# Patient Record
Sex: Male | Born: 1965 | Race: White | Hispanic: No | Marital: Married | Smoking: Never smoker
Health system: Southern US, Community
[De-identification: ages and names within clinical notes are randomized; demographics above are authoritative.]

## PROBLEM LIST (undated history)

## (undated) DIAGNOSIS — K259 Gastric ulcer, unspecified as acute or chronic, without hemorrhage or perforation: Secondary | ICD-10-CM

## (undated) DIAGNOSIS — E785 Hyperlipidemia, unspecified: Secondary | ICD-10-CM

## (undated) DIAGNOSIS — M1712 Unilateral primary osteoarthritis, left knee: Secondary | ICD-10-CM

## (undated) DIAGNOSIS — F32A Depression, unspecified: Secondary | ICD-10-CM

## (undated) DIAGNOSIS — M109 Gout, unspecified: Secondary | ICD-10-CM

## (undated) DIAGNOSIS — R7303 Prediabetes: Secondary | ICD-10-CM

## (undated) HISTORY — PX: ROTATOR CUFF REPAIR: SHX139

## (undated) HISTORY — PX: CYST EXCISION: SHX5701

## (undated) HISTORY — PX: LASIK: SHX215

## (undated) HISTORY — PX: KNEE ARTHROSCOPY: SUR90

---

## 2010-02-12 ENCOUNTER — Ambulatory Visit: Payer: Self-pay | Admitting: Internal Medicine

## 2011-09-16 ENCOUNTER — Emergency Department: Payer: Self-pay | Admitting: *Deleted

## 2012-01-01 ENCOUNTER — Ambulatory Visit: Payer: Self-pay | Admitting: Otolaryngology

## 2012-02-17 ENCOUNTER — Ambulatory Visit: Payer: Self-pay | Admitting: Otolaryngology

## 2012-02-19 LAB — PATHOLOGY REPORT

## 2012-04-01 ENCOUNTER — Ambulatory Visit: Payer: Self-pay | Admitting: Family Medicine

## 2012-04-01 ENCOUNTER — Emergency Department: Payer: Self-pay | Admitting: Unknown Physician Specialty

## 2012-04-01 LAB — CBC WITH DIFFERENTIAL/PLATELET
Basophil #: 0 10*3/uL (ref 0.0–0.1)
Basophil %: 0.4 %
Eosinophil #: 0.2 10*3/uL (ref 0.0–0.7)
Eosinophil %: 1.6 %
HGB: 14.8 g/dL (ref 13.0–18.0)
Lymphocyte #: 1.5 10*3/uL (ref 1.0–3.6)
MCH: 30.8 pg (ref 26.0–34.0)
MCHC: 33.9 g/dL (ref 32.0–36.0)
MCV: 91 fL (ref 80–100)
Monocyte #: 0.9 x10 3/mm (ref 0.2–1.0)
Neutrophil %: 72.7 %

## 2013-08-03 ENCOUNTER — Ambulatory Visit: Payer: Self-pay | Admitting: Emergency Medicine

## 2013-10-20 ENCOUNTER — Emergency Department: Payer: Self-pay | Admitting: Emergency Medicine

## 2014-09-13 NOTE — Op Note (Signed)
PATIENT NAME:  Dustin Nguyen, [name] MR#:  [id number] DATE OF BIRTH:  07-25-1965  DATE OF PROCEDURE:  02/17/2012  PREOPERATIVE DIAGNOSIS: Right submandibular gland sialolithiasis.   POSTOPERATIVE DIAGNOSIS: Right submandibular gland sialolithiasis.   PROCEDURE PERFORMED: Excision of right submandibular gland, transcervical.  SURGEON: Bud Facereighton Donavon Kimrey, MD   ASSISTANT: Karlyne GreenspanWilliam Angala Hilgers, MD  ANESTHESIA: General endotracheal anesthesia.   ESTIMATED BLOOD LOSS: 10 mL.   IV FLUIDS: Please see anesthesia record.   COMPLICATIONS: None.   DRAINS/STENT PLACEMENTS: None.   SPECIMENS: Right submandibular gland and sialolith.  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   OPERATIVE FINDINGS: Right lingual nerve identified and preserved. Right hypoglossal identified and preserved. 5 mm sialolith in the hilum of the right submandibular gland.   DESCRIPTION OF PROCEDURE: After the patient was identified in holding and the benefits and risks of the procedure were discussed and consent was reviewed, the patient was taken to the Operating Room and placed in the supine position. General endotracheal anesthesia was induced. The patient was rotated 90 degrees and shoulder roll was placed. The patient's neck was prepped and draped in a sterile fashion after injection of 10 mL of 1% lidocaine with 1:100,000 units epinephrine. An incision was made along the inferior border of the submandibular gland, on the patient's right side, along a previously marked skin crease. Dissection was carried through the subcutaneous tissues down to the level of the platysma using Bovie electrocautery. The platysma was transected and the inferior border of the submandibular gland was encountered. The fascia of the submandibular gland was reflected superiorly. The facial vein was ligated with the Harmonic scalpel and  the fascia overlying the submandibular gland was retracted superiorly for protection of the marginal mandibular nerve. At this time, with a combination of blunt and sharp techniques, the submandibular gland was circumferentially dissected. Posteriorly the facial artery branches to the submandibular gland were transected with the Harmonic scalpel. The mylohyoid muscle was moderately adherent to the anterior portion of the submandibular gland. This was a combination of blunt and sharp techniques using the Harmonic scalpel. It was used to dissect the mylohyoid away from the submandibular gland. This was then retracted superiorly and the anterior portion of the gland was identified. The lingual nerve as it coursed superiorly to the gland was identified and the ganglion attachment was transected with the Harmonic scalpel just at the level of the nerve. At this time, the duct and the sialolith within the duct was encountered. The duct was transected with the Harmonic scalpel and care was taken to ensure no sialolith was within the duct and the specimen. Then the remaining attachments of the submandibular gland were excised using a Harmonic scalpel. The wound was copiously irrigated with sterile saline. The platysma was reapproximated using 4-0 Vicryl, the subcutaneous tissue was reapproximated using 4-0 Vicryl, and the skin was closed with Dermabond adhesive. At this time, care of the patient was transferred to anesthesia where the patient tolerated the procedure well and was taken to PAC-U in good condition where his marginal mandibular nerve was intact and working fully.  ____________________________ Kyung Ruddreighton C. Ryann Pauli, MD ccv:slb D: 02/17/2012 08:59:07 ET T: 02/17/2012 09:47:08 ET JOB#: [id number]  cc: Kyung Ruddreighton C. Cheryll Keisler, MD, <Dictator> Kyung RuddREIGHTON C Gillian Kluever MD ELECTRONICALLY SIGNED 02/29/2012 9:56

## 2015-05-25 ENCOUNTER — Other Ambulatory Visit: Payer: Self-pay | Admitting: Surgery

## 2015-05-25 DIAGNOSIS — M75121 Complete rotator cuff tear or rupture of right shoulder, not specified as traumatic: Secondary | ICD-10-CM

## 2015-05-25 DIAGNOSIS — M25511 Pain in right shoulder: Secondary | ICD-10-CM

## 2015-05-25 DIAGNOSIS — M7581 Other shoulder lesions, right shoulder: Secondary | ICD-10-CM

## 2015-05-25 DIAGNOSIS — M778 Other enthesopathies, not elsewhere classified: Secondary | ICD-10-CM

## 2015-06-02 ENCOUNTER — Ambulatory Visit
Admission: RE | Admit: 2015-06-02 | Discharge: 2015-06-02 | Disposition: A | Discharge status: Home / Self Care | Payer: BLUE CROSS/BLUE SHIELD | Source: Ambulatory Visit | Attending: Surgery | Admitting: Surgery

## 2015-06-02 DIAGNOSIS — M7581 Other shoulder lesions, right shoulder: Secondary | ICD-10-CM

## 2015-06-02 DIAGNOSIS — M75101 Unspecified rotator cuff tear or rupture of right shoulder, not specified as traumatic: Secondary | ICD-10-CM | POA: Insufficient documentation

## 2015-06-02 DIAGNOSIS — M25511 Pain in right shoulder: Secondary | ICD-10-CM | POA: Insufficient documentation

## 2015-06-02 DIAGNOSIS — M75121 Complete rotator cuff tear or rupture of right shoulder, not specified as traumatic: Secondary | ICD-10-CM

## 2015-06-02 DIAGNOSIS — M778 Other enthesopathies, not elsewhere classified: Secondary | ICD-10-CM

## 2015-06-02 MED ORDER — GADOBENATE DIMEGLUMINE 529 MG/ML IV SOLN: 10.0000 mL | Freq: Once | INTRAVENOUS | Status: DC | PRN

## 2015-06-02 MED ORDER — IOHEXOL 300 MG/ML  SOLN: 100.0000 mL | Freq: Once | INTRAMUSCULAR | Status: DC | PRN

## 2015-06-15 ENCOUNTER — Other Ambulatory Visit: Payer: Self-pay

## 2015-06-15 ENCOUNTER — Encounter: Payer: Self-pay | Admitting: *Deleted

## 2015-06-15 NOTE — Patient Instructions (Signed)
  Your procedure is scheduled on: 06-20-15 Report to [facility] 2ND FLOOR To find out your arrival time please call [phone] between 1PM - 3PM on 06-19-15  Remember: Instructions that are not followed completely may result in serious medical risk, up to and including death, or upon the discretion of your surgeon and anesthesiologist your surgery may need to be rescheduled.    _X___ 1. Do not eat food or drink liquids after midnight. No gum chewing or hard candies.     _X___ 2. No Alcohol for 24 hours before or after surgery.   ____ 3. Bring all medications with you on the day of surgery if instructed.    ____ 4. Notify your doctor if there is any change in your medical condition     (cold, fever, infections).     Do not wear jewelry, make-up, hairpins, clips or nail polish.  Do not wear lotions, powders, or perfumes. You may wear deodorant.  Do not shave 48 hours prior to surgery. Men may shave face and neck.  Do not bring valuables to the hospital.    [facility] is not responsible for any belongings or valuables.               Contacts, dentures or bridgework may not be worn into surgery.  Leave your suitcase in the car. After surgery it may be brought to your room.  For patients admitted to the hospital, discharge time is determined by your treatment team.   Patients discharged the day of surgery will not be allowed to drive home.   Please read over the following fact sheets that you were given:      ____ Take these medicines the morning of surgery with A SIP OF WATER:    1. NONE  2.   3.   4.  5.  6.  ____ Fleet Enema (as directed)   ____ Use CHG Soap as directed  ____ Use inhalers on the day of surgery  ____ Stop metformin 2 days prior to surgery    ____ Take 1/2 of usual insulin dose the night before surgery and none on the morning of surgery.   ____ Stop Coumadin/Plavix/aspirin-N/A  ____ Stop Anti-inflammatories-NO NSAIDS OR ASA  PRODUCTS-ES TYLENOL OK/TRAMADOL OK TO CONTINUE   ____ Stop supplements until after surgery.    ____ Bring C-Pap to the hospital.

## 2015-06-20 ENCOUNTER — Encounter
Admission: RE | Disposition: A | Discharge status: Home / Self Care | Payer: Self-pay | Source: Ambulatory Visit | Attending: Surgery

## 2015-06-20 ENCOUNTER — Ambulatory Visit
Admission: RE | Admit: 2015-06-20 | Discharge: 2015-06-20 | Disposition: A | Discharge status: Home / Self Care | Payer: BLUE CROSS/BLUE SHIELD | Source: Ambulatory Visit | Attending: Surgery | Admitting: Surgery

## 2015-06-20 ENCOUNTER — Ambulatory Visit: Payer: BLUE CROSS/BLUE SHIELD | Admitting: Anesthesiology

## 2015-06-20 ENCOUNTER — Encounter: Payer: Self-pay | Admitting: *Deleted

## 2015-06-20 DIAGNOSIS — Z8711 Personal history of peptic ulcer disease: Secondary | ICD-10-CM | POA: Insufficient documentation

## 2015-06-20 DIAGNOSIS — K219 Gastro-esophageal reflux disease without esophagitis: Secondary | ICD-10-CM | POA: Diagnosis not present

## 2015-06-20 DIAGNOSIS — Z801 Family history of malignant neoplasm of trachea, bronchus and lung: Secondary | ICD-10-CM | POA: Insufficient documentation

## 2015-06-20 DIAGNOSIS — M75101 Unspecified rotator cuff tear or rupture of right shoulder, not specified as traumatic: Secondary | ICD-10-CM | POA: Insufficient documentation

## 2015-06-20 DIAGNOSIS — X500XXS Overexertion from strenuous movement or load, sequela: Secondary | ICD-10-CM | POA: Insufficient documentation

## 2015-06-20 DIAGNOSIS — Z79899 Other long term (current) drug therapy: Secondary | ICD-10-CM | POA: Diagnosis not present

## 2015-06-20 HISTORY — PX: SHOULDER ARTHROSCOPY: SHX128

## 2015-06-20 HISTORY — DX: Gastric ulcer, unspecified as acute or chronic, without hemorrhage or perforation: K25.9

## 2015-06-20 SURGERY — ARTHROSCOPY, SHOULDER
Anesthesia: General | Site: Shoulder | Laterality: Right

## 2015-06-20 MED ORDER — CEFAZOLIN SODIUM-DEXTROSE 2-3 GM-% IV SOLR: 2.0000 g | Freq: Once | INTRAVENOUS | Status: DC

## 2015-06-20 MED ORDER — CEFAZOLIN SODIUM-DEXTROSE 2-3 GM-% IV SOLR
INTRAVENOUS | Status: AC
  Filled 2015-06-20: qty 50

## 2015-06-20 MED ORDER — MIDAZOLAM HCL 2 MG/2ML IJ SOLN
INTRAMUSCULAR | Status: DC | PRN
  Administered 2015-06-20 (×2): 1 mg via INTRAVENOUS

## 2015-06-20 MED ORDER — BUPIVACAINE-EPINEPHRINE (PF) 0.5% -1:200000 IJ SOLN
INTRAMUSCULAR | Status: AC
  Filled 2015-06-20: qty 30

## 2015-06-20 MED ORDER — ONDANSETRON HCL 4 MG/2ML IJ SOLN
INTRAMUSCULAR | Status: DC | PRN
  Administered 2015-06-20: 4 mg via INTRAVENOUS

## 2015-06-20 MED ORDER — EPINEPHRINE HCL 1 MG/ML IJ SOLN
INTRAMUSCULAR | Status: AC
  Filled 2015-06-20: qty 2

## 2015-06-20 MED ORDER — EPINEPHRINE HCL 1 MG/ML IJ SOLN
INTRAMUSCULAR | Status: DC | PRN
  Administered 2015-06-20: 2 mL

## 2015-06-20 MED ORDER — FENTANYL CITRATE (PF) 100 MCG/2ML IJ SOLN
INTRAMUSCULAR | Status: AC
  Administered 2015-06-20: 25 ug via INTRAVENOUS
  Filled 2015-06-20: qty 2

## 2015-06-20 MED ORDER — SUCCINYLCHOLINE CHLORIDE 20 MG/ML IJ SOLN
INTRAMUSCULAR | Status: DC | PRN
  Administered 2015-06-20: 100 mg via INTRAVENOUS

## 2015-06-20 MED ORDER — PROPOFOL 10 MG/ML IV BOLUS
INTRAVENOUS | Status: DC | PRN
  Administered 2015-06-20: 20 mg via INTRAVENOUS
  Administered 2015-06-20: 100 mg via INTRAVENOUS

## 2015-06-20 MED ORDER — LACTATED RINGERS IV SOLN
INTRAVENOUS | Status: DC
  Administered 2015-06-20 (×2): via INTRAVENOUS

## 2015-06-20 MED ORDER — FAMOTIDINE 20 MG PO TABS
20.0000 mg | ORAL_TABLET | Freq: Once | ORAL | Status: AC
  Administered 2015-06-20: 20 mg via ORAL

## 2015-06-20 MED ORDER — DEXAMETHASONE SODIUM PHOSPHATE 4 MG/ML IJ SOLN
INTRAMUSCULAR | Status: DC | PRN
  Administered 2015-06-20: 10 mg via INTRAVENOUS

## 2015-06-20 MED ORDER — OXYCODONE HCL 5 MG PO TABS
ORAL_TABLET | ORAL | Status: AC
  Administered 2015-06-20: 5 mg via ORAL
  Filled 2015-06-20: qty 1

## 2015-06-20 MED ORDER — OXYCODONE HCL 5 MG PO TABS: 5.0000 mg | ORAL_TABLET | ORAL | Status: DC | PRN

## 2015-06-20 MED ORDER — FENTANYL CITRATE (PF) 100 MCG/2ML IJ SOLN
INTRAMUSCULAR | Status: DC | PRN
Start: 2015-06-20 — End: 2015-06-20
  Administered 2015-06-20 (×5): 50 ug via INTRAVENOUS

## 2015-06-20 MED ORDER — ONDANSETRON HCL 4 MG/2ML IJ SOLN
4.0000 mg | Freq: Once | INTRAMUSCULAR | Status: DC | PRN
Start: 2015-06-20 — End: 2015-06-20

## 2015-06-20 MED ORDER — FAMOTIDINE 20 MG PO TABS
ORAL_TABLET | ORAL | Status: AC
  Administered 2015-06-20: 20 mg via ORAL
  Filled 2015-06-20: qty 1

## 2015-06-20 MED ORDER — LIDOCAINE HCL (CARDIAC) 20 MG/ML IV SOLN
INTRAVENOUS | Status: DC | PRN
  Administered 2015-06-20: 100 mg via INTRAVENOUS

## 2015-06-20 MED ORDER — BUPIVACAINE-EPINEPHRINE (PF) 0.5% -1:200000 IJ SOLN
INTRAMUSCULAR | Status: DC | PRN
  Administered 2015-06-20: 27 mL via PERINEURAL

## 2015-06-20 MED ORDER — OXYCODONE HCL 5 MG PO TABS
5.0000 mg | ORAL_TABLET | ORAL | Status: DC | PRN
  Administered 2015-06-20: 5 mg via ORAL

## 2015-06-20 MED ORDER — CEFAZOLIN SODIUM 1-5 GM-% IV SOLN
INTRAVENOUS | Status: DC | PRN
  Administered 2015-06-20: 2 g via INTRAVENOUS

## 2015-06-20 MED ORDER — FENTANYL CITRATE (PF) 100 MCG/2ML IJ SOLN
25.0000 ug | INTRAMUSCULAR | Status: AC | PRN
  Administered 2015-06-20 (×6): 25 ug via INTRAVENOUS

## 2015-06-20 SURGICAL SUPPLY — 58 items
ANCHOR JUGGERKNOT WTAP NDL 2.9 (Anchor) ×6 IMPLANT
ANCHOR SUT QUATTRO KNTLS 4.5 (Anchor) ×3 IMPLANT
BIT DRILL JUGRKNT W/NDL BIT2.9 (DRILL) ×2 IMPLANT
BLADE FULL RADIUS 3.5 (BLADE) ×2 IMPLANT
BLADE SHAVER 4.5X7 STR FR (MISCELLANEOUS) ×1 IMPLANT
BUR ACROMIONIZER 4.0 (BURR) ×2 IMPLANT
BUR BR 5.5 WIDE MOUTH (BURR) ×1 IMPLANT
CANNULA 8.5X75 THRED (CANNULA) ×1 IMPLANT
CANNULA SHAVER 8MMX76MM (CANNULA) ×2 IMPLANT
CHLORAPREP W/TINT 26ML (MISCELLANEOUS) ×4 IMPLANT
DRAPE IMP U-DRAPE 54X76 (DRAPES) ×4 IMPLANT
DRAPE SURG 17X11 SM STRL (DRAPES) ×2 IMPLANT
DRILL JUGGERKNOT W/NDL BIT 2.9 (DRILL) ×2
DRSG OPSITE POSTOP 4X8 (GAUZE/BANDAGES/DRESSINGS) ×2 IMPLANT
ELECT REM PT RETURN 9FT ADLT (ELECTROSURGICAL) ×2
ELECTRODE REM PT RTRN 9FT ADLT (ELECTROSURGICAL) ×1 IMPLANT
GAUZE PETRO XEROFOAM 1X8 (MISCELLANEOUS) ×2 IMPLANT
GAUZE SPONGE 4X4 12PLY STRL (GAUZE/BANDAGES/DRESSINGS) ×2 IMPLANT
GLOVE BIO SURGEON STRL SZ7.5 (GLOVE) ×4 IMPLANT
GLOVE BIO SURGEON STRL SZ8 (GLOVE) ×4 IMPLANT
GLOVE BIOGEL PI IND STRL 8 (GLOVE) ×1 IMPLANT
GLOVE BIOGEL PI INDICATOR 8 (GLOVE) ×1
GLOVE INDICATOR 8.0 STRL GRN (GLOVE) ×2 IMPLANT
GOWN STRL REUS W/ TWL LRG LVL3 (GOWN DISPOSABLE) ×2 IMPLANT
GOWN STRL REUS W/ TWL XL LVL3 (GOWN DISPOSABLE) ×1 IMPLANT
GOWN STRL REUS W/TWL LRG LVL3 (GOWN DISPOSABLE) ×2
GOWN STRL REUS W/TWL XL LVL3 (GOWN DISPOSABLE) ×1
GRASPER SUT 15 45D LOW PRO (SUTURE) ×2 IMPLANT
IV LACTATED RINGER IRRG 3000ML (IV SOLUTION) ×2
IV LR IRRIG 3000ML ARTHROMATIC (IV SOLUTION) ×2 IMPLANT
MANIFOLD NEPTUNE II (INSTRUMENTS) ×2 IMPLANT
MASK FACE SPIDER DISP (MASK) ×2 IMPLANT
MAT BLUE FLOOR 46X72 FLO (MISCELLANEOUS) ×2 IMPLANT
NDL MAYO 6 CRC TAPER PT (NEEDLE) ×1 IMPLANT
NDL MAYO CATGUT SZ 2 (NEEDLE) ×1 IMPLANT
NDL MAYO CATGUT SZ4 (NEEDLE) ×2 IMPLANT
NDL MAYO CATGUT SZ4 TCR NDL (NEEDLE) ×1 IMPLANT
NDL REVERSE CUT 1/2 CRC (NEEDLE) ×1 IMPLANT
NEEDLE HYPO 22GX1.5 SAFETY (NEEDLE) ×2 IMPLANT
NEEDLE MAYO 6 CRC TAPER PT (NEEDLE) IMPLANT
NEEDLE MAYO CATGUT SZ 1.5 (NEEDLE) ×2
NEEDLE MAYO CATGUT SZ 2 (NEEDLE) ×1 IMPLANT
NEEDLE REVERSE CUT 1/2 CRC (NEEDLE) IMPLANT
PACK ARTHROSCOPY SHOULDER (MISCELLANEOUS) ×2 IMPLANT
SLING ARM LRG DEEP (SOFTGOODS) ×1 IMPLANT
SLING ULTRA II LG (MISCELLANEOUS) ×2 IMPLANT
STAPLER SKIN PROX 35W (STAPLE) ×2 IMPLANT
STRAP SAFETY BODY (MISCELLANEOUS) ×2 IMPLANT
SUT ETHIBOND 0 MO6 C/R (SUTURE) ×2 IMPLANT
SUT FIBERWIRE #2 38 T-5 BLUE (SUTURE) ×2
SUT PROLENE 4 0 PS 2 18 (SUTURE) ×2 IMPLANT
SUT VIC AB 2-0 CT1 27 (SUTURE) ×2
SUT VIC AB 2-0 CT1 TAPERPNT 27 (SUTURE) ×2 IMPLANT
SUTURE FIBERWR #2 38 T-5 BLUE (SUTURE) IMPLANT
TAPE MICROFOAM 4IN (TAPE) ×2 IMPLANT
TUBING ARTHRO INFLOW-ONLY STRL (TUBING) ×2 IMPLANT
TUBING CONNECTING 10 (TUBING) ×2 IMPLANT
WAND HAND CNTRL MULTIVAC 90 (MISCELLANEOUS) ×2 IMPLANT

## 2015-06-20 NOTE — Anesthesia Preprocedure Evaluation (Signed)
Anesthesia Evaluation  Patient identified by MRN, date of birth, ID band Patient awake    Reviewed: Allergy & Precautions, NPO status , Patient's Chart, lab work & pertinent test results, reviewed documented beta blocker date and time   Airway Mallampati: II  TM Distance: >3 FB     Dental  (+) Chipped   Pulmonary           Cardiovascular      Neuro/Psych    GI/Hepatic PUD,   Endo/Other    Renal/GU      Musculoskeletal   Abdominal   Peds  Hematology   Anesthesia Other Findings   Reproductive/Obstetrics                             Anesthesia Physical Anesthesia Plan  ASA: II  Anesthesia Plan: General   Post-op Pain Management: MAC Combined w/ Regional for Post-op pain   Induction: Intravenous  Airway Management Planned: Oral ETT  Additional Equipment:   Intra-op Plan:   Post-operative Plan:   Informed Consent: I have reviewed the patients History and Physical, chart, labs and discussed the procedure including the risks, benefits and alternatives for the proposed anesthesia with the patient or authorized representative who has indicated his/her understanding and acceptance.     Plan Discussed with: CRNA  Anesthesia Plan Comments:         Anesthesia Quick Evaluation

## 2015-06-20 NOTE — H&P (Addendum)
Paper H&P to be scanned into permanent record. H&P reviewed. No changes. Lungs: Clear to auscultation, no wheezes. CV:  RRR, no murmurs.

## 2015-06-20 NOTE — Op Note (Signed)
06/20/2015  9:42 AM  Patient:   Dustin Nguyen  Pre-Op Diagnosis:   Recurrent rotator cuff tear, right shoulder.  Postoperative diagnosis: Same.  Procedure: Limited arthroscopic debridement, arthroscopic subacromial decompression, and mini-open rotator cuff repair, right shoulder.  Anesthesia: General endotracheal with interscalene block placed preoperatively by the anesthesiologist.  Surgeon:   Maryagnes Amos, MD  Assistant:   Horris Latino, PA-C; Amber Race, PA-S  Findings: As above. There was mild fraying of the labrum anteriorly and superiorly, but otherwise the labrum was well attached to the glenoid circumferentially. There was a full-thickness tear of the super spinatus tendon with mild retraction. The remainder of the rotator cuff was in excellent condition. The biceps tendon itself was in satisfactory condition, as were the articular surfaces of both the glenoid and humerus.  Complications: None  Fluids:   800 cc  Estimated blood loss: 5 cc  Tourniquet time: None  Drains: None  Closure: Staples   Brief clinical note: The patient is a 50 year old male with a nearly one-year history of right shoulder pain following a softball injury. He had been doing well following a rotator cuff repair in 1995. His symptoms have progressed despite medications, activity modification, etc. The patient's history and examination are consistent wita recurrentuff tear. These findings were confirmed by MRI scan. The patient presents at this time for definitive management of these shoulder symptoms.  Procedure: The patient was brought into the operating room and lain in the supine position. After adequate IV sedation was achieved, the patient underwent placement of an interscalene block by the anesthesiologist. The patient then underwent general endotracheal intubation and anesthesia before being repositioned in the beach chair position using the beach chair  positioner. Thrightulder and upper extremity were prepped with ChloraPrep solution before being draped sterilely. Preoperative antibiotics were administered. A timeout was performed to confirm the proper surgical site before the expected portal sites and incision site were injected with 0.5% Sensorcaine with epinephrine. A posterior portal was created and the glenohumeral joint thoroughly inspected with the findings as described above. An anterior portal was created using an outside-in technique. The labrum and rotator cuff were further probed, again confirming the above-noted findings. The full-radius resector was inserted and used to debride the torn portion of the rotator cuff. The full radius resector also was used to debride the frayed portions of the anterior and superior aspects of the labrum. The biceps tendon was probed and pulled into the joint to assess it more thoroughly with the findings as described above The ArthroCare wand was inserted and used to obtain hemostasis as well as to "anneal" the labrum superiorly and anteriorly. The instruments were removed from the joint after suctioning the excess fluid.  The camera was repositioned through the posterior portal into the subacromial space. A separate lateral portal was created using an outside-in technique. The 3.5 mm full-radius resector was introduced and used to perform a subtotal bursectomy. The ArthroCare wand was then inserted and used to remove the periosteal tissue off the undersurface of the anterior third of the acromion as well as to recess the coracoacromial ligament from its attachment along the anterior and lateral margins of the acromion. The  full-radius resector also was used to complete the decompression by removing the undersurface of the anterior third of the acromion as well as to remove any residual bony debris before the ArthroCare wand was reintroduced to obtain hemostasis. The instruments were then removed from the subacromial  space after suctioning the excess fluid.  An approximately 4-5 cm incision was made over the anterolateral aspect of the shoulder incorporating the previous incision and ellipsing out the old scar. This incision was carried down through the subcutaneous tissues to expose the deltoid fascia. The raphae between the anterior and middle thirds was identified and this plane developed to provide access into the subacromial space. Additional bursal tissues were debrided sharply using Metzenbaum scissors. The rotator cuff tear was readily identified. The margins were debrided sharply with a #15 blade and the exposed greater tuberosity roughened with a rongeur. The tear was repaired using two Biomet 2.9 mm JuggerKnot anchors. These sutures were then brought back laterally and secured using two Cayenne Quattro knotless anchors to create a two-layer closure. An additional #2 FiberWire was passed through the more anterior portion of the tear and secured using a third Cayenne Quattro knotless anchor to complete the repair. An apparent watertight closure was obtained.  The wound was copiously irrigated with sterile saline solution before the deltoid raphae was reapproximated using 2-0 Vicryl interrupted sutures. The subcutaneous tissues were closed in two layers using 2-0 Vicryl interrupted sutures before the skin was closed using staples. The portal sites also were closed using staples. A sterile bulky dressing was applied to the shoulder before the arm was placed into a shoulder immobilizer. The patient was then awakened, extubated, and returned to the recovery room in satisfactory condition after tolerating the procedure well.

## 2015-06-20 NOTE — Discharge Instructions (Addendum)
Keep dressing dry and intact.  °May shower after dressing changed on post-op day #4 (Saturday).  °Cover staples with Band-Aids after drying off. °Apply ice frequently to shoulder. °Keep shoulder immobilizer on at all times except may remove for bathing purposes. Follow-up in 10-14 days or as scheduled. ° °General Anesthesia, Adult, Care After °Refer to this sheet in the next few weeks. These instructions provide you with information on caring for yourself after your procedure. Your health care provider may also give you more specific instructions. Your treatment has been planned according to current medical practices, but problems sometimes occur. Call your health care provider if you have any problems or questions after your procedure. °WHAT TO EXPECT AFTER THE PROCEDURE °After the procedure, it is typical to experience: °· Sleepiness. °· Nausea and vomiting. °HOME CARE INSTRUCTIONS °· For the first 24 hours after general anesthesia: °¨ Have a responsible person with you. °¨ Do not drive a car. If you are alone, do not take public transportation. °¨ Do not drink alcohol. °¨ Do not take medicine that has not been prescribed by your health care provider. °¨ Do not sign important papers or make important decisions. °¨ You may resume a normal diet and activities as directed by your health care provider. °· Change bandages (dressings) as directed. °· If you have questions or problems that seem related to general anesthesia, call the hospital and ask for the anesthetist or anesthesiologist on call. °SEEK MEDICAL CARE IF: °· You have nausea and vomiting that continue the day after anesthesia. °· You develop a rash. °SEEK IMMEDIATE MEDICAL CARE IF:  °· You have difficulty breathing. °· You have chest pain. °· You have any allergic problems. °  °This information is not intended to replace advice given to you by your health care provider. Make sure you discuss any questions you have with your health care provider. °    °Document Released: 08/19/2000 Document Revised: 06/03/2014 Document Reviewed: 09/11/2011 °Elsevier Interactive Patient Education ©2016 [employer] ° °

## 2015-06-20 NOTE — Transfer of Care (Signed)
Immediate Anesthesia Transfer of Care Note  Patient: Dustin Nguyen  Procedure(s) Performed: Procedure(s): ARTHROSCOPY SHOULDER rotator cuff repair with limited debridement- right (Right)  Patient Location: PACU  Anesthesia Type:General  Level of Consciousness:  Airway & Oxygen Therapy: Patient Spontanous Breathing  Post-op Assessment: Report given to RN and Post -op Vital signs reviewed and stable  Post vital signs: Reviewed and stable  Last Vitals:  Filed Vitals:   06/20/15 0608  BP: 129/65  Pulse: 57  Temp: 36.9 C  Resp: 14    Complications: No apparent anesthesia complications

## 2015-06-20 NOTE — Anesthesia Postprocedure Evaluation (Signed)
Anesthesia Post Note  Patient: Dustin Nguyen  Procedure(s) Performed: Procedure(s) (LRB): ARTHROSCOPY SHOULDER rotator cuff repair with limited debridement- right (Right)  Patient location during evaluation: Other Anesthesia Type: General Level of consciousness: awake Pain management: pain level controlled Vital Signs Assessment: post-procedure vital signs reviewed and stable Respiratory status: spontaneous breathing Cardiovascular status: blood pressure returned to baseline Anesthetic complications: no    Last Vitals:  Filed Vitals:   06/20/15 1125 06/20/15 1148  BP: 142/81 133/76  Pulse: 86 72  Temp:    Resp: 14 14    Last Pain:  Filed Vitals:   06/20/15 1200  PainSc: 2                  Tarnesha Ulloa S

## 2015-06-21 NOTE — Addendum Note (Signed)
Addendum  created 06/21/15 1046 by Berdine Addison, MD   Modules edited: Anesthesia Blocks and Procedures, Clinical Notes   Clinical Notes:  File: [id number]

## 2015-06-21 NOTE — Anesthesia Procedure Notes (Signed)
Anesthesia Regional Block:  Interscalene brachial plexus block  Pre-Anesthetic Checklist: ,, timeout performed, Correct Patient, Correct Site, Correct Laterality, Correct Procedure, Correct Position, site marked, Risks and benefits discussed,  Surgical consent,  Pre-op evaluation,  At surgeon's request and post-op pain management   Prep: Betadine       Needles:  Injection technique: Single-shot  Needle Type: Echogenic Stimulator Needle     Needle Length: 5cm 5 cm Needle Gauge: 21 and 21 G    Additional Needles:  Procedures: ultrasound guided (picture in chart) and nerve stimulator Interscalene brachial plexus block  Nerve Stimulator or Paresthesia:  Response: biceps flexion, 0.8 mA,   Additional Responses:   Narrative:  Injection made incrementally with aspirations every 5 mL.  Performed by: Personally  Anesthesiologist: Berdine Addison  Additional Notes: Functioning IV was confirmed and monitors were applied.  A 50mm 22ga Arrow echogenic stimulator needle was used. Sterile prep and drape,hand hygiene and sterile gloves were used.  Negative aspiration and negative test dose prior to incremental administration of local anesthetic. The patient tolerated the procedure well.  Ultrasound guidance: relevent anatomy identified, needle position confirmed, local anesthetic spread visualized around nerve(s), vascular puncture avoided.   20ml of 0.25%marcaine and 0.1mg  epi injected without problems.

## 2015-09-18 ENCOUNTER — Encounter: Payer: Self-pay | Admitting: *Deleted

## 2015-09-20 ENCOUNTER — Ambulatory Visit: Payer: BLUE CROSS/BLUE SHIELD | Admitting: Anesthesiology

## 2015-09-20 ENCOUNTER — Ambulatory Visit
Admission: RE | Admit: 2015-09-20 | Discharge: 2015-09-20 | Disposition: A | Discharge status: Home / Self Care | Payer: BLUE CROSS/BLUE SHIELD | Source: Ambulatory Visit | Attending: Surgery | Admitting: Surgery

## 2015-09-20 ENCOUNTER — Encounter
Admission: RE | Disposition: A | Discharge status: Home / Self Care | Payer: Self-pay | Source: Ambulatory Visit | Attending: Surgery

## 2015-09-20 DIAGNOSIS — Z801 Family history of malignant neoplasm of trachea, bronchus and lung: Secondary | ICD-10-CM | POA: Diagnosis not present

## 2015-09-20 DIAGNOSIS — Z79899 Other long term (current) drug therapy: Secondary | ICD-10-CM | POA: Diagnosis not present

## 2015-09-20 DIAGNOSIS — K219 Gastro-esophageal reflux disease without esophagitis: Secondary | ICD-10-CM | POA: Insufficient documentation

## 2015-09-20 DIAGNOSIS — M7501 Adhesive capsulitis of right shoulder: Secondary | ICD-10-CM | POA: Insufficient documentation

## 2015-09-20 HISTORY — PX: CLOSED MANIPULATION SHOULDER WITH STERIOD INJECTION: SHX5611

## 2015-09-20 SURGERY — CLOSED MANIPULATION SHOULDER WITH STEROID INJECTION
Anesthesia: Monitor Anesthesia Care | Laterality: Right | Wound class: Clean

## 2015-09-20 MED ORDER — DEXAMETHASONE SODIUM PHOSPHATE 4 MG/ML IJ SOLN
INTRAMUSCULAR | Status: DC | PRN
  Administered 2015-09-20: 4 mg via PERINEURAL

## 2015-09-20 MED ORDER — ONDANSETRON HCL 4 MG/2ML IJ SOLN: 4.0000 mg | Freq: Four times a day (QID) | INTRAMUSCULAR | Status: DC | PRN

## 2015-09-20 MED ORDER — ONDANSETRON HCL 4 MG PO TABS: 4.0000 mg | ORAL_TABLET | Freq: Four times a day (QID) | ORAL | Status: DC | PRN

## 2015-09-20 MED ORDER — ACETAMINOPHEN 160 MG/5ML PO SOLN: 325.0000 mg | ORAL | Status: DC | PRN

## 2015-09-20 MED ORDER — METOCLOPRAMIDE HCL 5 MG/ML IJ SOLN: 5.0000 mg | Freq: Three times a day (TID) | INTRAMUSCULAR | Status: DC | PRN

## 2015-09-20 MED ORDER — OXYCODONE HCL 5 MG PO TABS: 5.0000 mg | ORAL_TABLET | ORAL | Status: DC | PRN

## 2015-09-20 MED ORDER — LACTATED RINGERS IV SOLN
INTRAVENOUS | Status: DC
  Administered 2015-09-20: 12:00:00 via INTRAVENOUS

## 2015-09-20 MED ORDER — PROPOFOL 10 MG/ML IV BOLUS
INTRAVENOUS | Status: DC | PRN
  Administered 2015-09-20: 120 mg via INTRAVENOUS

## 2015-09-20 MED ORDER — ACETAMINOPHEN 325 MG PO TABS
325.0000 mg | ORAL_TABLET | ORAL | Status: DC | PRN
Start: 2015-09-20 — End: 2015-09-20

## 2015-09-20 MED ORDER — FENTANYL CITRATE (PF) 100 MCG/2ML IJ SOLN: 25.0000 ug | INTRAMUSCULAR | Status: DC | PRN

## 2015-09-20 MED ORDER — ONDANSETRON HCL 4 MG/2ML IJ SOLN: 4.0000 mg | Freq: Once | INTRAMUSCULAR | Status: DC | PRN

## 2015-09-20 MED ORDER — OXYCODONE HCL 5 MG PO TABS: 5.0000 mg | ORAL_TABLET | Freq: Once | ORAL | Status: DC | PRN

## 2015-09-20 MED ORDER — TRIAMCINOLONE ACETONIDE 40 MG/ML IJ SUSP
INTRAMUSCULAR | Status: DC | PRN
  Administered 2015-09-20: 40 mg

## 2015-09-20 MED ORDER — BUPIVACAINE-EPINEPHRINE 0.25% -1:200000 IJ SOLN
INTRAMUSCULAR | Status: DC | PRN
  Administered 2015-09-20: 9 mL

## 2015-09-20 MED ORDER — ROPIVACAINE HCL 5 MG/ML IJ SOLN
INTRAMUSCULAR | Status: DC | PRN
Start: 2015-09-20 — End: 2015-09-20
  Administered 2015-09-20: 30 mL via PERINEURAL

## 2015-09-20 MED ORDER — OXYCODONE HCL 5 MG/5ML PO SOLN: 5.0000 mg | Freq: Once | ORAL | Status: DC | PRN

## 2015-09-20 MED ORDER — METOCLOPRAMIDE HCL 5 MG PO TABS
5.0000 mg | ORAL_TABLET | Freq: Three times a day (TID) | ORAL | Status: DC | PRN
Start: 2015-09-20 — End: 2015-09-20

## 2015-09-20 MED ORDER — FENTANYL CITRATE (PF) 100 MCG/2ML IJ SOLN
INTRAMUSCULAR | Status: DC | PRN
  Administered 2015-09-20: 100 ug via INTRAVENOUS

## 2015-09-20 MED ORDER — POTASSIUM CHLORIDE IN NACL 20-0.9 MEQ/L-% IV SOLN: INTRAVENOUS | Status: DC

## 2015-09-20 MED ORDER — MIDAZOLAM HCL 5 MG/5ML IJ SOLN
INTRAMUSCULAR | Status: DC | PRN
Start: 2015-09-20 — End: 2015-09-20
  Administered 2015-09-20 (×2): 2 mg via INTRAVENOUS

## 2015-09-20 SURGICAL SUPPLY — 9 items
BNDG ADH 2 X3.75 FABRIC TAN LF (GAUZE/BANDAGES/DRESSINGS) ×2 IMPLANT
KIT ROOM TURNOVER OR (KITS) ×2 IMPLANT
NEEDLE HYPO 21X1.5 SAFETY (NEEDLE) ×2 IMPLANT
PAD ALCOHOL SWAB (MISCELLANEOUS) ×2 IMPLANT
SLING ARM LRG DEEP (SOFTGOODS) ×2 IMPLANT
SLING ARM M TX990204 (SOFTGOODS) IMPLANT
SLING ARM S TX990203 (SOFTGOODS) IMPLANT
STRAP BODY AND KNEE 60X3 (MISCELLANEOUS) ×2 IMPLANT
SYRINGE 10CC LL (SYRINGE) ×2 IMPLANT

## 2015-09-20 NOTE — H&P (Signed)
Paper H&P to be scanned into permanent record. H&P reviewed. No changes. 

## 2015-09-20 NOTE — Anesthesia Procedure Notes (Addendum)
Anesthesia Regional Block:  Interscalene brachial plexus block  Pre-Anesthetic Checklist: ,, timeout performed, Correct Patient, Correct Site, Correct Laterality, Correct Procedure, Correct Position, site marked, Risks and benefits discussed,  Surgical consent,  Pre-op evaluation,  At surgeon's request and post-op pain management  Laterality: Right  Prep: chloraprep       Needles:  Injection technique: Single-shot  Needle Type: Stimiplex     Needle Length: 10cm 10 cm Needle Gauge: 21 and 21 G    Additional Needles:  Procedures: ultrasound guided (picture in chart) Interscalene brachial plexus block Narrative:  Start time: 09/20/2015 11:39 AM End time: 09/20/2015 11:46 AM Injection made incrementally with aspirations every 5 mL.  Performed by: Personally  Anesthesiologist: Johny BlamerKOVACS, DANIEL D  Additional Notes: Functioning IV was confirmed and monitors applied. Ultrasound guidance: relevant anatomy identified, needle position confirmed, local anesthetic spread visualized around nerve(s)., vascular puncture avoided.  Image printed for medical record.  Negative aspiration and no paresthesias; incremental administration of local anesthetic. The patient tolerated the procedure well. Vitals signes recorded in RN notes.   Performed by: Jimmy PicketAMYOT, Alanys Godino Pre-anesthesia Checklist: Patient identified, Emergency Drugs available, Suction available, Timeout performed and Patient being monitored Patient Re-evaluated:Patient Re-evaluated prior to inductionOxygen Delivery Method: Circle system utilized Preoxygenation: Pre-oxygenation with 100% oxygen Intubation Type: IV induction Ventilation: Mask ventilation without difficulty and Mask ventilation throughout procedure Dental Injury: Teeth and Oropharynx as per pre-operative assessment

## 2015-09-20 NOTE — Transfer of Care (Signed)
Immediate Anesthesia Transfer of Care Note  Patient: Dustin SalterKenneth J Nguyen  Procedure(s) Performed: Procedure(s): CLOSED MANIPULATION SHOULDER WITH STEROID INJECTION (Right)  Patient Location: PACU  Anesthesia Type: Regional, MAC  Level of Consciousness: awake, alert  and patient cooperative  Airway and Oxygen Therapy: Patient Spontanous Breathing and Patient connected to supplemental oxygen  Post-op Assessment: Post-op Vital signs reviewed, Patient's Cardiovascular Status Stable, Respiratory Function Stable, Patent Airway and No signs of Nausea or vomiting  Post-op Vital Signs: Reviewed and stable  Complications: No apparent anesthesia complications

## 2015-09-20 NOTE — Discharge Instructions (Signed)
General Anesthesia, Adult, Care After Refer to this sheet in the next few weeks. These instructions provide you with information on caring for yourself after your procedure. Your health care provider may also give you more specific instructions. Your treatment has been planned according to current medical practices, but problems sometimes occur. Call your health care provider if you have any problems or questions after your procedure. WHAT TO EXPECT AFTER THE PROCEDURE After the procedure, it is typical to experience:  Sleepiness.  Nausea and vomiting. HOME CARE INSTRUCTIONS  For the first 24 hours after general anesthesia:  Have a responsible person with you.  Do not drive a car. If you are alone, do not take public transportation.  Do not drink alcohol.  Do not take medicine that has not been prescribed by your health care provider.  Do not sign important papers or make important decisions.  You may resume a normal diet and activities as directed by your health care provider.  Change bandages (dressings) as directed.  If you have questions or problems that seem related to general anesthesia, call the hospital and ask for the anesthetist or anesthesiologist on call. SEEK MEDICAL CARE IF:  You have nausea and vomiting that continue the day after anesthesia.  You develop a rash. SEEK IMMEDIATE MEDICAL CARE IF:   You have difficulty breathing.  You have chest pain.  You have any allergic problems.   This information is not intended to replace advice given to you by your health care provider. Make sure you discuss any questions you have with your health care provider.   Document Released: 08/19/2000 Document Revised: 06/03/2014 Document Reviewed: 09/11/2011 Elsevier Interactive Patient Education 2016 [employer]  May shower in AM.  Apply ice frequently to shoulder. May remove shoulder sling when block wears off and able to control arm safely. Begin PT tomorrow as  scheduled. Follow-up in 10-14 days or as scheduled.

## 2015-09-20 NOTE — Progress Notes (Signed)
Assisted Daniel Kovacs ANMD with right, interscalene  block. Side rails up, monitors on throughout procedure. See vital signs in flow sheet. Tolerated Procedure well. ° °

## 2015-09-20 NOTE — Op Note (Signed)
09/20/2015  12:40 PM  Patient:   Dustin Nguyen  Pre-Op Diagnosis:   Secondary adhesive capsulitis, right shoulder.  Post-Op Diagnosis:   Same.  Procedure:   Manipulation under anesthesia with steroid injection, right shoulder.  Surgeon:   Maryagnes AmosJ. Jeffrey Poggi, MD  Assistant:   None  Anesthesia:   IV sedation with interscalene block placed preoperatively by anesthesiologist.  Findings:   As above. Prior to manipulation, the right shoulder could be forward flexed to 120 and abducted to 110. At 90 of abduction, the shoulder could be externally rotated to 60 and internally rotated to 20. Following manipulation, the shoulder could be forward flexed to 165, abducted to 160 and, at 90 of abduction, externally rotated to 90 and internally rotated to 70.  Complications:   None  EBL:   0 cc  Fluids:   350 cc crystalloid  TT:   None  Drains:   None  Closure:   None  Brief Clinical Note:   The patient is a 50 year old male who is now 3 months status post a right shoulder arthroscopy with debridement, decompression, and repair of a recurrent rotator cuff tear. Despite extensive physical therapy, the patient continues to have difficulty regaining shoulder range of motion. The patient's history and examination are consistent with adhesive capsulitis. The patient presents at this time for a manipulation under anesthesia with steroid injection of the right shoulder.  Procedure:   The patient underwent placement of an interscalene block in the preoperative holding area before being brought into the operating room and lain in the supine position. After adequate IV sedation was achieved, a timeout was performed to verify the correct surgical site. The right shoulder was gently manipulated in both abduction and external rotation, as well as adduction and internal rotation. Several palpable and audible pops were heard as the scar tissue released, permitting full range of motion of the  shoulder. The glenohumeral joint was injected sterilely using 1 cc of Dustin Nguyen 40 and 9 cc of 0.25% Dustin Nguyen with epinephrine before the patient was placed into a sling. The patient was then awakened and returned to the recovery room in satisfactory condition after tolerating the procedure well.

## 2015-09-20 NOTE — Anesthesia Postprocedure Evaluation (Signed)
Anesthesia Post Note  Patient: Dustin SalterKenneth J Brunet  Procedure(s) Performed: Procedure(s) (LRB): CLOSED MANIPULATION SHOULDER WITH STEROID INJECTION (Right)  Patient location during evaluation: PACU Anesthesia Type: MAC Level of consciousness: awake and alert Pain management: pain level controlled Vital Signs Assessment: post-procedure vital signs reviewed and stable Respiratory status: spontaneous breathing, nonlabored ventilation and respiratory function stable Cardiovascular status: blood pressure returned to baseline and stable Postop Assessment: no signs of nausea or vomiting Anesthetic complications: no    DANIEL D KOVACS

## 2015-09-20 NOTE — Anesthesia Preprocedure Evaluation (Signed)
Anesthesia Evaluation  Patient identified by MRN, date of birth, ID band Patient awake    Reviewed: Allergy & Precautions, NPO status , Patient's Chart, lab work & pertinent test results, reviewed documented beta blocker date and time   Airway Mallampati: II  TM Distance: >3 FB     Dental  (+) Chipped   Pulmonary           Cardiovascular      Neuro/Psych    GI/Hepatic PUD,   Endo/Other    Renal/GU      Musculoskeletal   Abdominal   Peds  Hematology   Anesthesia Other Findings   Reproductive/Obstetrics                             Anesthesia Physical  Anesthesia Plan  ASA: II  Anesthesia Plan: Regional and MAC   Post-op Pain Management:  Regional for Post-op pain   Induction: Intravenous  Airway Management Planned:   Additional Equipment:   Intra-op Plan:   Post-operative Plan:   Informed Consent: I have reviewed the patients History and Physical, chart, labs and discussed the procedure including the risks, benefits and alternatives for the proposed anesthesia with the patient or authorized representative who has indicated his/her understanding and acceptance.     Plan Discussed with: CRNA  Anesthesia Plan Comments:         Anesthesia Quick Evaluation

## 2015-09-21 ENCOUNTER — Encounter: Payer: Self-pay | Admitting: Surgery

## 2015-11-06 ENCOUNTER — Emergency Department: Payer: BLUE CROSS/BLUE SHIELD

## 2015-11-06 ENCOUNTER — Emergency Department
Admission: EM | Admit: 2015-11-06 | Discharge: 2015-11-06 | Disposition: A | Discharge status: Home / Self Care | Payer: BLUE CROSS/BLUE SHIELD | Attending: Emergency Medicine | Admitting: Emergency Medicine

## 2015-11-06 ENCOUNTER — Encounter: Payer: Self-pay | Admitting: Emergency Medicine

## 2015-11-06 DIAGNOSIS — Y9389 Activity, other specified: Secondary | ICD-10-CM | POA: Insufficient documentation

## 2015-11-06 DIAGNOSIS — Y999 Unspecified external cause status: Secondary | ICD-10-CM | POA: Diagnosis not present

## 2015-11-06 DIAGNOSIS — Y929 Unspecified place or not applicable: Secondary | ICD-10-CM | POA: Diagnosis not present

## 2015-11-06 DIAGNOSIS — M79674 Pain in right toe(s): Secondary | ICD-10-CM | POA: Diagnosis present

## 2015-11-06 DIAGNOSIS — W1840XA Slipping, tripping and stumbling without falling, unspecified, initial encounter: Secondary | ICD-10-CM | POA: Insufficient documentation

## 2015-11-06 DIAGNOSIS — S93501A Unspecified sprain of right great toe, initial encounter: Secondary | ICD-10-CM

## 2015-11-06 MED ORDER — NAPROXEN 500 MG PO TABS: 500.0000 mg | ORAL_TABLET | Freq: Two times a day (BID) | ORAL | Status: DC

## 2015-11-06 NOTE — ED Notes (Signed)
Pt discharged home after verbalizing understanding of discharge instructions; nad noted. 

## 2015-11-06 NOTE — ED Notes (Signed)
Patient states that he tripped over his flip flop on Saturday and has pain and swelling to his right foot.

## 2015-11-06 NOTE — Discharge Instructions (Signed)

## 2015-11-06 NOTE — ED Provider Notes (Signed)
[facility] Emergency Department Provider Note  _____________________[facility]_______________________  Time seen: 7:20 AM  I have reviewed the triage vital signs and the nursing notes.   HISTORY  Chief Complaint Foot Pain    HPI Dustin Nguyen is a 50 y.o. male who complains of pain in the right great toe for the last 2 days ever since tripping over his flip flop. He has been aware bear weight on the right foot. No other injuries other than the right great toe. No laceration or puncture. No wounds. No fever. No leg swelling. No chest pain or shortness of breath.     Past Medical History  Diagnosis Date  . Stomach ulcer     2010     There are no active problems to display for this patient.    Past Surgical History  Procedure Laterality Date  . Rotator cuff repair    . Knee arthroscopy Bilateral   . Lasik    . Cyst excision      NECK  . Shoulder arthroscopy Right 06/20/2015    Procedure: ARTHROSCOPY SHOULDER rotator cuff repair with limited debridement- right;  Surgeon: Christena FlakeJohn J Poggi, MD;  Location: [facility] ORS;  Service: Orthopedics;  Laterality: Right;  . Closed manipulation shoulder with steriod injection Right 09/20/2015    Procedure: CLOSED MANIPULATION SHOULDER WITH STEROID INJECTION;  Surgeon: Christena FlakeJohn J Poggi, MD;  [facility];  Service: Orthopedics;  Laterality: Right;     Current Outpatient Rx  Name  Route  Sig  Dispense  Refill  . AMERICAN GINSENG PO   Oral   Take by mouth daily.         . Multiple Vitamin (MULTIVITAMIN) capsule   Oral   Take 1 capsule by mouth daily.         . naproxen (NAPROSYN) 500 MG tablet   Oral   Take 1 tablet (500 mg total) by mouth 2 (two) times daily with a meal.   20 tablet   0   . oxyCODONE (ROXICODONE) 5 MG immediate release tablet   Oral   Take 1-2 tablets (5-10 mg total) by mouth every 4 (four) hours as needed for severe pain.   30 tablet   0   . Saw Palmetto, Serenoa repens,  (SAW PALMETTO PO)   Oral   Take by mouth daily.            Allergies Review of patient's allergies indicates no known allergies.   No family history on file.  Social History Social History  Substance Use Topics  . Smoking status: Never Smoker   . Smokeless tobacco: None  . Alcohol Use: 4.8 oz/week    8 Cans of beer per week     Comment:      Review of Systems  Constitutional:   No fever or chills.  Cardiovascular:   No chest pain. Respiratory:   No dyspnea or cough. Musculoskeletal:   Right great toe pain 10-point ROS otherwise negative.  ____________________________________________   PHYSICAL EXAM:  VITAL SIGNS: ED Triage Vitals  Enc Vitals Group     BP 11/06/15 0555 138/81 mmHg     Pulse Rate 11/06/15 0555 55     Resp 11/06/15 0555 20     Temp 11/06/15 0555 97.8 F (36.6 C)     Temp Source 11/06/15 0555 Oral     SpO2 11/06/15 0555 98 %     Weight 11/06/15 0555 173 lb (78.472 kg)     Height 11/06/15  0555  (1.753 m)     Head Cir --      Peak Flow --      Pain Score 11/06/15 0555 9     Pain Loc --      Pain Edu? --      Excl. in GC? --     Vital signs reviewed, nursing assessments reviewed.   Constitutional:   Alert and oriented. Well appearing and in no distress. Eyes:   No scleral icterus.  ENT   Head:   Normocephalic and atraumatic. Cardiovascular: Normal capillary refill in toes of the foot, normal DP pulse. Musculoskeletal:   Slight swelling and tenderness at the right first MTP joint. There is intact range of motion of the great toe. No bony point tenderness. Other toes, bones of the foot, ankle and lower leg are all nontender and unremarkable. Full range of motion in all joints. Neurologic:   Normal speech and language.  Motor grossly intact. Sensation intact No gross focal neurologic deficits are appreciated.  Skin:    Skin is warm, dry and intact. No rash noted.  No petechiae, purpura, or  bullae.  ____________________________________________    LABS (pertinent positives/negatives) (all labs ordered are listed, but only abnormal results are displayed) Labs Reviewed - No data to display ____________________________________________   EKG    ____________________________________________    RADIOLOGY  X-ray right foot unremarkable  ____________________________________________   PROCEDURES   ____________________________________________   INITIAL IMPRESSION / ASSESSMENT AND PLAN / ED COURSE  Pertinent labs & imaging results that were available during my care of the patient were reviewed by me and considered in my medical decision making (see chart for details).  Patient presents with pain and swelling at the right MTP joint. Not consistent with gout. Onset after tripping over his flip flop. Consistent with a sprain/turf toe.  Patient given postop shoe, NSAIDs, home care instructions and recommended to follow-up with primary care or podiatry in a week.     ____________________________________________   FINAL CLINICAL IMPRESSION(S) / ED DIAGNOSES  Final diagnoses:  Sprain of great toe, right, initial encounter       Portions of this note were generated with dragon dictation software. Dictation errors may occur despite best attempts at proofreading.   Sharman Cheek, MD 11/06/15 [phone]

## 2015-11-06 NOTE — ED Notes (Signed)
Pt presents with swelling to right great toe (base). States he tripped over a flip flop and has been in pain ever since. States that the pain made it difficult to sleep last night.

## 2015-11-07 ENCOUNTER — Ambulatory Visit
Admission: EM | Admit: 2015-11-07 | Discharge: 2015-11-07 | Disposition: A | Discharge status: Home / Self Care | Payer: BLUE CROSS/BLUE SHIELD | Attending: Internal Medicine | Admitting: Internal Medicine

## 2015-11-07 ENCOUNTER — Encounter: Payer: Self-pay | Admitting: Emergency Medicine

## 2015-11-07 DIAGNOSIS — M10071 Idiopathic gout, right ankle and foot: Secondary | ICD-10-CM | POA: Diagnosis not present

## 2015-11-07 DIAGNOSIS — M109 Gout, unspecified: Secondary | ICD-10-CM

## 2015-11-07 MED ORDER — PREDNISONE 5 MG PO TABS: 5.0000 mg | ORAL_TABLET | Freq: Every day | ORAL | Status: DC

## 2015-11-07 MED ORDER — COLCHICINE 0.6 MG PO TABS: 0.6000 mg | ORAL_TABLET | Freq: Every day | ORAL | Status: DC

## 2015-11-07 NOTE — ED Notes (Signed)
Patient states he tripped over a flip flop on Saturday and injured his right great toe.  Patient states he has a history of gout.

## 2015-11-07 NOTE — ED Provider Notes (Signed)
CSN: [id number]     Arrival date & time 11/07/15  1055 History   First MD Initiated Contact with Patient 11/07/15 1207     Chief Complaint  Patient presents with  . Toe Pain   (Consider location/radiation/quality/duration/timing/severity/associated sxs/prior Treatment) HPI Comments: This is a 50 year old gentleman who presents in clinic complaining of right great toe pain. He initially attributed it to tripping over a flip-flop 3 days ago. He was seen in the ED at that time. X-rays showed no fracture and the patient was diagnosed with a sprain. However the following night he states his great toe was so painful that he could not lay a sheet over it. Since that time he has felt worsening pain and has had difficulty walking due to pain. He tried some leftover oxycodone from a shoulder surgery that barely relieved his pain. He has also iced the joint without any relief. He admits this is happened approximately 3 times in the last 2 years.  Patient is a 50 y.o. male presenting with toe pain. The history is provided by the patient.  Toe Pain This is a recurrent problem. The current episode started 2 days ago. The problem has been gradually worsening. Pertinent negatives include no chest pain, no abdominal pain and no shortness of breath. The symptoms are aggravated by walking. He has tried a cold compress for the symptoms. The treatment provided no relief.    Past Medical History  Diagnosis Date  . Stomach ulcer     2010   Past Surgical History  Procedure Laterality Date  . Rotator cuff repair    . Knee arthroscopy Bilateral   . Lasik    . Cyst excision      NECK  . Shoulder arthroscopy Right 06/20/2015    Procedure: ARTHROSCOPY SHOULDER rotator cuff repair with limited debridement- right;  Surgeon: Christena FlakeJohn J Poggi, MD;  Location: [facility];  Service: Orthopedics;  Laterality: Right;  . Closed manipulation shoulder with steriod injection Right 09/20/2015    Procedure: CLOSED MANIPULATION SHOULDER  WITH STEROID INJECTION;  Surgeon: Christena FlakeJohn J Poggi, MD;  [facility];  Service: Orthopedics;  Laterality: Right;   History reviewed. No pertinent family history. Social History  Substance Use Topics  . Smoking status: Never Smoker   . Smokeless tobacco: None  . Alcohol Use: 4.8 oz/week    8 Cans of beer per week     Comment:      Review of Systems  Respiratory: Negative for shortness of breath.   Cardiovascular: Negative for chest pain.  Gastrointestinal: Negative for abdominal pain.  Musculoskeletal: Positive for joint swelling.  All other systems reviewed and are negative.   Allergies  Review of patient's allergies indicates no known allergies.  Home Medications   Prior to Admission medications   Medication Sig Start Date End Date Taking? Authorizing Provider  AMERICAN GINSENG PO Take by mouth daily.   Yes Historical Provider, MD  Multiple Vitamin (MULTIVITAMIN) capsule Take 1 capsule by mouth daily.   Yes Historical Provider, MD  naproxen (NAPROSYN) 500 MG tablet Take 1 tablet (500 mg total) by mouth 2 (two) times daily with a meal. 11/06/15  Yes Sharman CheekPhillip Stafford, MD  oxyCODONE (ROXICODONE) 5 MG immediate release tablet Take 1-2 tablets (5-10 mg total) by mouth every 4 (four) hours as needed for severe pain. 09/20/15  Yes Christena FlakeJohn J Poggi, MD  Saw Palmetto, Serenoa repens, (SAW PALMETTO PO) Take by mouth daily.   Yes Historical Provider, MD  colchicine 0.6 MG  tablet Take 1 tablet (0.6 mg total) by mouth daily. Take 2 tablets now, then 1 tab an hour later 11/07/15   Arnaldo Natal, MD  predniSONE (DELTASONE) 5 MG tablet Take 1 tablet (5 mg total) by mouth daily with breakfast. 4 tabs by mouth day 1, then 3 tabs by mouth day 2, then 2 tabs by mouth day 3, then 1 tab by mouth on day 4, then stop. 11/07/15   Arnaldo Natal, MD   Meds Ordered and Administered this Visit  Medications - No data to display  BP 123/78 mmHg  Pulse 63  Temp(Src) 97.9 F (36.6 C) (Oral)   Resp 18  Ht  (1.753 m)  Wt 180 lb (81.647 kg)  BMI 26.57 kg/m2  SpO2 97% No data found.   Physical Exam  Constitutional: He is oriented to person, place, and time. He appears well-developed and well-nourished.  HENT:  Head: Normocephalic and atraumatic.  Mouth/Throat: Oropharynx is clear and moist.  Eyes: Conjunctivae and EOM are normal. Pupils are equal, round, and reactive to light.  Neck: Normal range of motion. Neck supple. No JVD present. No tracheal deviation present. No thyromegaly present.  Cardiovascular: Normal rate and regular rhythm.  Exam reveals no gallop and no friction rub.   No murmur heard. Pulmonary/Chest: Effort normal and breath sounds normal. No respiratory distress.  Abdominal: Soft. Bowel sounds are normal. He exhibits no distension. There is no tenderness.  Genitourinary:  Deferred  Musculoskeletal: Normal range of motion. He exhibits no edema.  Warmth, swelling and erythema of the first joint of right great toe  Lymphadenopathy:    He has no cervical adenopathy.  Neurological: He is alert and oriented to person, place, and time. No cranial nerve deficit.  Skin: Skin is warm and dry. No rash noted. No erythema.  Psychiatric: He has a normal mood and affect. His behavior is normal. Judgment and thought content normal.  Nursing note and vitals reviewed.   ED Course  Procedures (including critical care time)  Labs Review Labs Reviewed - No data to display  Imaging Review Dg Foot Complete Right  11/06/2015  CLINICAL DATA:  Trip and fall injury on Saturday. Pain and swelling to the right foot at the base of the big toe. EXAM: RIGHT FOOT COMPLETE - 3+ VIEW COMPARISON:  None. FINDINGS: There is no evidence of fracture or dislocation. There is no evidence of arthropathy or other focal bone abnormality. Soft tissues are unremarkable. IMPRESSION: Negative. Electronically Signed   By: Burman Nieves M.D.   On: 11/06/2015 06:44     Visual Acuity  Review  Right Eye Distance:   Left Eye Distance:   Bilateral Distance:    Right Eye Near:   Left Eye Near:    Bilateral Near:         MDM   1. Acute gout of right foot, unspecified cause    Prescribed colchicine and prednisone taper. Advised establishing care with primary doctor to check uric acid levels and potentially start allopurinol and/or Uloric.    Arnaldo Natal, MD 11/07/15 1251

## 2015-11-07 NOTE — Discharge Instructions (Signed)
Gout Gout is an inflammatory arthritis caused by a buildup of uric acid crystals in the joints. Uric acid is a chemical that is normally present in the blood. When the level of uric acid in the blood is too high it can form crystals that deposit in your joints and tissues. This causes joint redness, soreness, and swelling (inflammation). Repeat attacks are common. Over time, uric acid crystals can form into masses (tophi) near a joint, destroying bone and causing disfigurement. Gout is treatable and often preventable. CAUSES  The disease begins with elevated levels of uric acid in the blood. Uric acid is produced by your body when it breaks down a naturally found substance called purines. Certain foods you eat, such as meats and fish, contain high amounts of purines. Causes of an elevated uric acid level include:  Being passed down from parent to child (heredity).  Diseases that cause increased uric acid production (such as obesity, psoriasis, and certain cancers).  Excessive alcohol use.  Diet, especially diets rich in meat and seafood.  Medicines, including certain cancer-fighting medicines (chemotherapy), water pills (diuretics), and aspirin.  Chronic kidney disease. The kidneys are no longer able to remove uric acid well.  Problems with metabolism. Conditions strongly associated with gout include:  Obesity.  High blood pressure.  High cholesterol.  Diabetes. Not everyone with elevated uric acid levels gets gout. It is not understood why some people get gout and others do not. Surgery, joint injury, and eating too much of certain foods are some of the factors that can lead to gout attacks. SYMPTOMS   An attack of gout comes on quickly. It causes intense pain with redness, swelling, and warmth in a joint.  Fever can occur.  Often, only one joint is involved. Certain joints are more commonly involved:  Base of the big toe.  Knee.  Ankle.  Wrist.  Finger. Without  treatment, an attack usually goes away in a few days to weeks. Between attacks, you usually will not have symptoms, which is different from many other forms of arthritis. DIAGNOSIS  Your caregiver will suspect gout based on your symptoms and exam. In some cases, tests may be recommended. The tests may include:  Blood tests.  Urine tests.  X-rays.  Joint fluid exam. This exam requires a needle to remove fluid from the joint (arthrocentesis). Using a microscope, gout is confirmed when uric acid crystals are seen in the joint fluid. TREATMENT  There are two phases to gout treatment: treating the sudden onset (acute) attack and preventing attacks (prophylaxis).  Treatment of an Acute Attack.  Medicines are used. These include anti-inflammatory medicines or steroid medicines.  An injection of steroid medicine into the affected joint is sometimes necessary.  The painful joint is rested. Movement can worsen the arthritis.  You may use warm or cold treatments on painful joints, depending which works best for you.  Treatment to Prevent Attacks.  If you suffer from frequent gout attacks, your caregiver may advise preventive medicine. These medicines are started after the acute attack subsides. These medicines either help your kidneys eliminate uric acid from your body or decrease your uric acid production. You may need to stay on these medicines for a very long time.  The early phase of treatment with preventive medicine can be associated with an increase in acute gout attacks. For this reason, during the first few months of treatment, your caregiver may also advise you to take medicines usually used for acute gout treatment. Be sure you  understand your caregiver's directions. Your caregiver may make several adjustments to your medicine dose before these medicines are effective.  Discuss dietary treatment with your caregiver or dietitian. Alcohol and drinks high in sugar and fructose and foods  such as meat, poultry, and seafood can increase uric acid levels. Your caregiver or dietitian can advise you on drinks and foods that should be limited. HOME CARE INSTRUCTIONS   Do not take aspirin to relieve pain. This raises uric acid levels.  Only take over-the-counter or prescription medicines for pain, discomfort, or fever as directed by your caregiver.  Rest the joint as much as possible. When in bed, keep sheets and blankets off painful areas.  Keep the affected joint raised (elevated).  Apply warm or cold treatments to painful joints. Use of warm or cold treatments depends on which works best for you.  Use crutches if the painful joint is in your leg.  Drink enough fluids to keep your urine clear or pale yellow. This helps your body get rid of uric acid. Limit alcohol, sugary drinks, and fructose drinks.  Follow your dietary instructions. Pay careful attention to the amount of protein you eat. Your daily diet should emphasize fruits, vegetables, whole grains, and fat-free or low-fat milk products. Discuss the use of coffee, vitamin C, and cherries with your caregiver or dietitian. These may be helpful in lowering uric acid levels.  Maintain a healthy body weight. SEEK MEDICAL CARE IF:   You develop diarrhea, vomiting, or any side effects from medicines.  You do not feel better in 24 hours, or you are getting worse. SEEK IMMEDIATE MEDICAL CARE IF:   Your joint becomes suddenly more tender, and you have chills or a fever. MAKE SURE YOU:   Understand these instructions.  Will watch your condition.  Will get help right away if you are not doing well or get worse.   This information is not intended to replace advice given to you by your health care provider. Make sure you discuss any questions you have with your health care provider.   Document Released: 05/10/2000 Document Revised: 06/03/2014 Document Reviewed: 12/25/2011 Elsevier Interactive Patient Education 2016  [place] are compounds that affect the level of uric acid in your body. A low-purine diet is a diet that is low in purines. Eating a low-purine diet can prevent the level of uric acid in your body from getting too high and causing gout or kidney stones or both. WHAT DO I NEED TO KNOW ABOUT THIS DIET?  Choose low-purine foods. Examples of low-purine foods are listed in the next section.  Drink plenty of fluids, especially water. Fluids can help remove uric acid from your body. Try to drink 8-16 cups (1.9-3.8 L) a day.  Limit foods high in fat, especially saturated fat, as fat makes it harder for the body to get rid of uric acid. Foods high in saturated fat include pizza, cheese, ice cream, whole milk, fried foods, and gravies. Choose foods that are lower in fat and lean sources of protein. Use olive oil when cooking as it contains healthy fats that are not high in saturated fat.  Limit alcohol. Alcohol interferes with the elimination of uric acid from your body. If you are having a gout attack, avoid all alcohol.  Keep in mind that different people's bodies react differently to different foods. You will probably learn over time which foods do or do not affect you. If you discover that a food tends to  foods that are lower in fat and lean sources of protein. Use olive oil when cooking as it contains healthy fats that are not high in saturated fat.  · Limit alcohol. Alcohol interferes with the elimination of uric acid from your body. If you are having a gout attack, avoid all alcohol.  · Keep in mind that different people's bodies react differently to different foods. You will probably learn over time which foods do or do not affect you. If you discover that a food tends to cause your gout to flare up, avoid eating that food. You can more freely enjoy foods that do not cause problems. If you have any questions about a food item, talk to your dietitian or health care provider.  WHICH FOODS ARE LOW, MODERATE, AND HIGH IN PURINES?  The following is a list of foods that are low, moderate, and high in purines. You can eat any amount of the foods that are low in purines. You may be able to have small amounts of foods that are moderate in purines. Ask your health care provider how much of a food moderate in purines you can have. Avoid foods high in purines.  Grains  · Foods low in purines: Enriched white bread, pasta, rice, cake, cornbread,  popcorn.  · Foods moderate in purines: Whole-grain breads and cereals, wheat germ, bran, oatmeal. Uncooked oatmeal. Dry wheat bran or wheat germ.  · Foods high in purines: Pancakes, French toast, biscuits, muffins.  Vegetables  · Foods low in purines: All vegetables, except those that are moderate in purines.  · Foods moderate in purines: Asparagus, cauliflower, spinach, mushrooms, green peas.  Fruits  · All fruits are low in purines.  Meats and other Protein Foods  · Foods low in purines: Eggs, nuts, peanut butter.  · Foods moderate in purines: 80-90% lean beef, lamb, veal, pork, poultry, fish, eggs, peanut butter, nuts. Crab, lobster, oysters, and shrimp. Cooked dried beans, peas, and lentils.  · Foods high in purines: Anchovies, sardines, herring, mussels, tuna, codfish, scallops, trout, and haddock. Bacon. Organ meats (such as liver or kidney). Tripe. Game meat. Goose. Sweetbreads.  Dairy  · All dairy foods are low in purines. Low-fat and fat-free dairy products are best because they are low in saturated fat.  Beverages  · Drinks low in purines: Water, carbonated beverages, tea, coffee, cocoa.  · Drinks moderate in purines: Soft drinks and other drinks sweetened with high-fructose corn syrup. Juices. To find whether a food or drink is sweetened with high-fructose corn syrup, look at the ingredients list.  · Drinks high in purines: Alcoholic beverages (such as beer).  Condiments  · Foods low in purines: Salt, herbs, olives, pickles, relishes, vinegar.  · Foods moderate in purines: Butter, margarine, oils, mayonnaise.  Fats and Oils  · Foods low in purines: All types, except gravies and sauces made with meat.  · Foods high in purines: Gravies and sauces made with meat.  Other Foods  · Foods low in purines: Sugars, sweets, gelatin. Cake. Soups made without meat.  · Foods moderate in purines: Meat-based or fish-based soups, broths, or bouillons. Foods and drinks sweetened with high-fructose corn syrup.  · Foods high  in purines: High-fat desserts (such as ice cream, cookies, cakes, pies, doughnuts, and chocolate).  Contact your dietitian for more information on foods that are not listed here.     This information is not intended to replace advice given to you by your health care provider. Make sure you discuss   acid can build up in your joints or kidneys. Uric acid crystals in your joints can cause a type of arthritis (gout). Crystals in your urine can form kidney stones. WHAT KIND OF SAMPLE IS TAKEN? Uric acid can be measured in blood and urine, so you may have either a blood test or a urine test.  Blood test. This test requires a blood sample taken from a vein in your hand or arm. You may have this test if you have joint pain that may be due to gout. You may also have this test if you are getting a type of cancer treatment that increases cell death and purines. This can lead to high uric acid levels.  Urine test. This test requires a sample of urine. You may need to collect all the urine you produce over a 24-hour period (24-hour urine sample). You may have the urine test if you have kidney stones. Finding out how much uric acid you are passing in your urine can help your health care provider determine the cause of your  kidney stones. HOW DO I PREPARE FOR THE TEST?   You may not be able to eat for 4 hours before the blood test or as directed by your health care provider.  Let your health care provider know if:  You have recently exercised a lot. Heavy exercise can increase uric acid levels.  You are taking any medicines. Some medicines can also affect uric acid. WHAT DO THE RESULTS MEAN? Your results will be compared with a reference range of values for this test. The reference ranges for the blood test are as follows:  Adult:  Male: 4.0-8.5 mg/dL or 8.11-9.140.24-0.51 mmol/L.  Male: 2.7-7.3 mg/dL or 7.82-9.560.16-0.43 mmol/L.  Child: 2.5-5.5 mg/dL or 2.13-0.860.12-0.32 mmol/L.  Newborn: 2.0-6.2 mg/dL. The reference range for the urine test is 250-750 mg per 24 hours or 1.48-4.43 mmol per day (SI units). Abnormally high levels of uric acid may mean:  Your body is making too much uric acid.  Your kidneys are not removing enough uric acid. You may need to have other tests. Many things can cause a high level of uric acid. Common causes include:  Gout.  Kidney disease.  Cancer or cancer treatment.  A diet high in purines.  Alcohol abuse.  Diabetes. Certain medicines can cause low levels of uric acid, but this is usually not a cause for concern.  Reference rangesmay vary among different labs and hospitals. Talk to your health care provider to discuss your results, treatment options, and if necessary, the need for more tests. It is your responsibility to obtain your test results. Ask the lab or department performing the test when and how you will get your results. Talk with your health care provider if you have any questions about your results.   This information is not intended to replace advice given to you by your health care provider. Make sure you discuss any questions you have with your health care provider.   Document Released: 06/07/2004 Document Revised: 06/03/2014 Document Reviewed: 09/06/2013 Elsevier  Interactive Patient Education [employer]2016 Elsevier Inc.

## 2017-02-09 IMAGING — CR DG FOOT COMPLETE 3+V*R*
1 series · 3 of 3 positions shown · non-contrast
Comparison: None.

CLINICAL DATA: Trip and fall injury on [REDACTED]. Pain and swelling
to the right foot at the base of the big toe.

EXAM:
RIGHT FOOT COMPLETE - 3+ VIEW

[Series 1: dg foot complete right · 0.14mm/px · 3 of 3 slices shown]
[im 1/3]
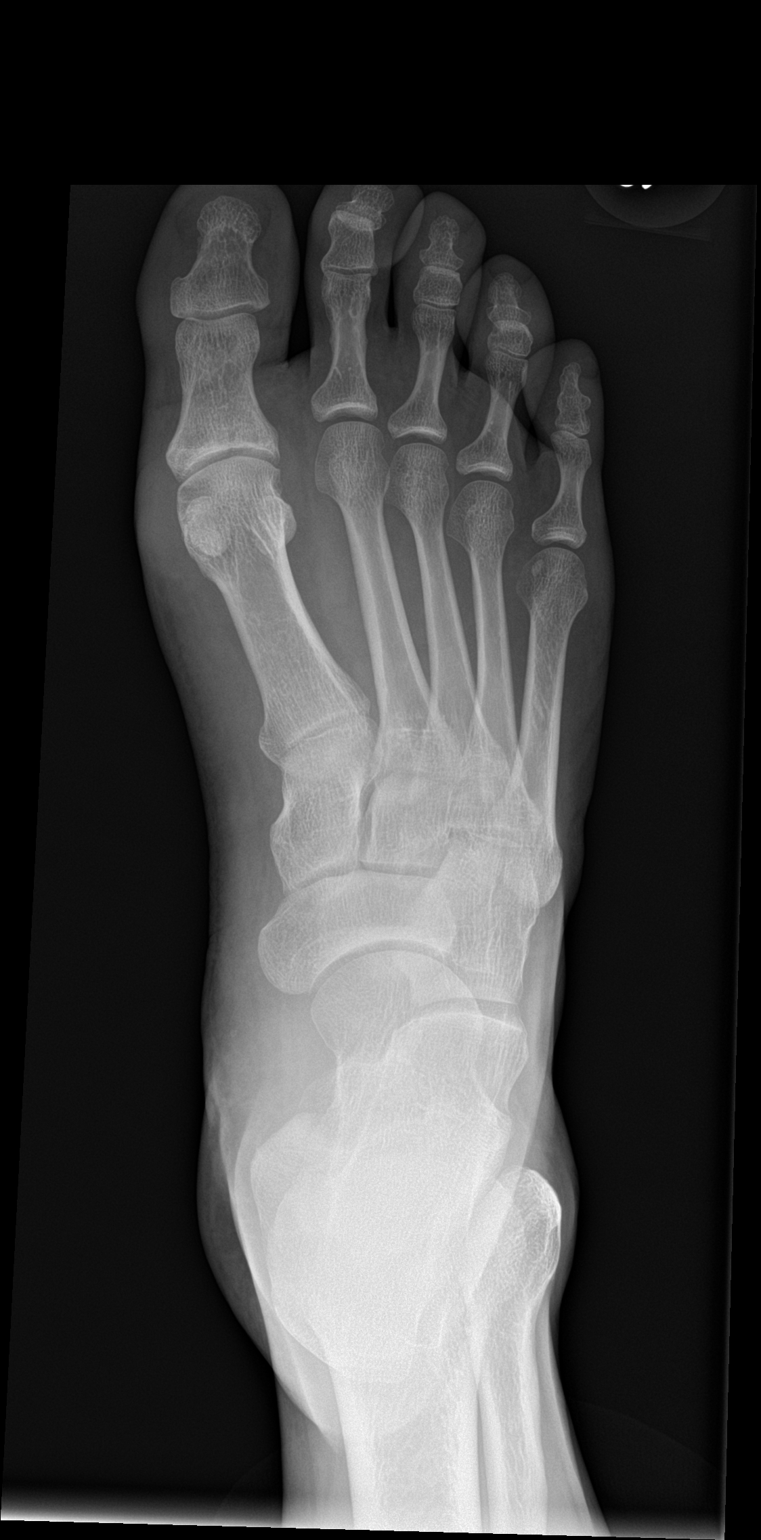
[im 2/3]
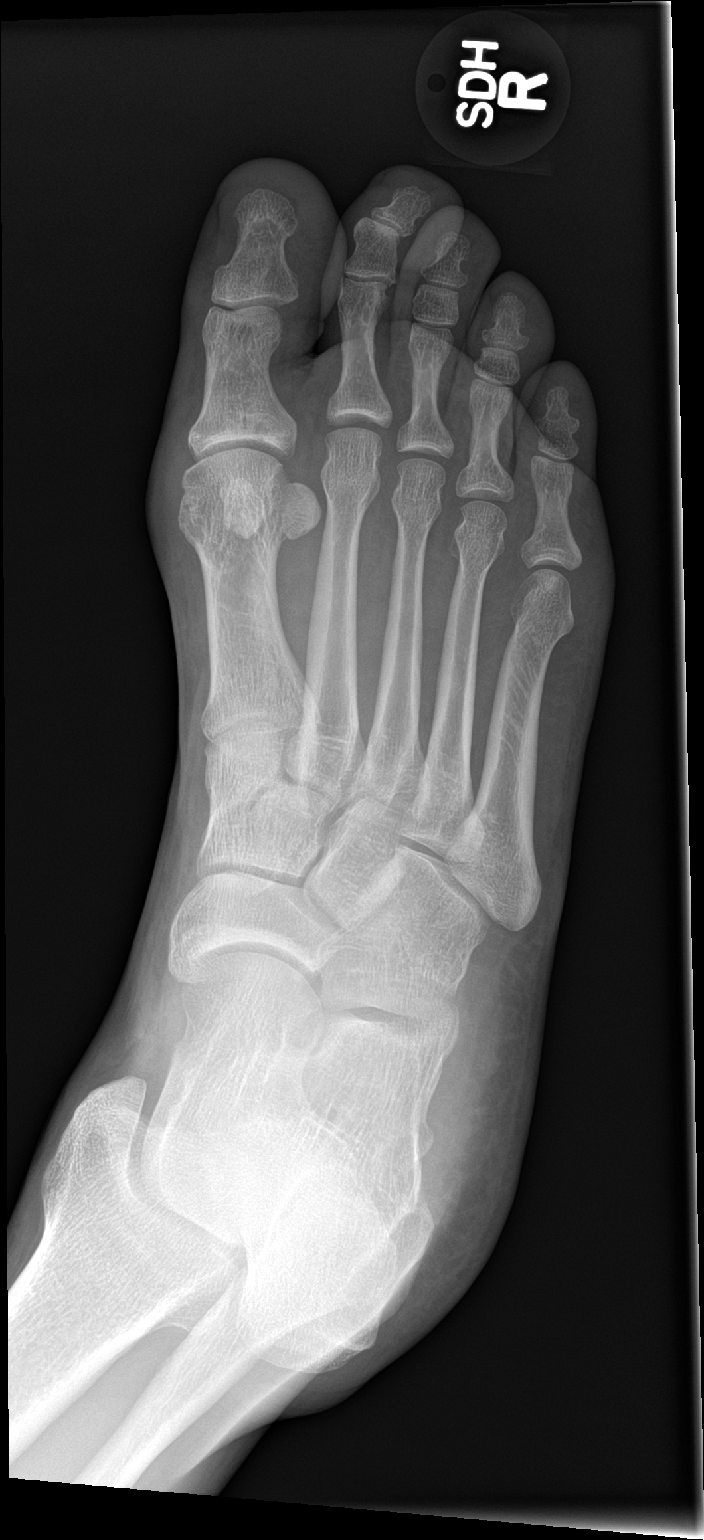
[im 3/3]
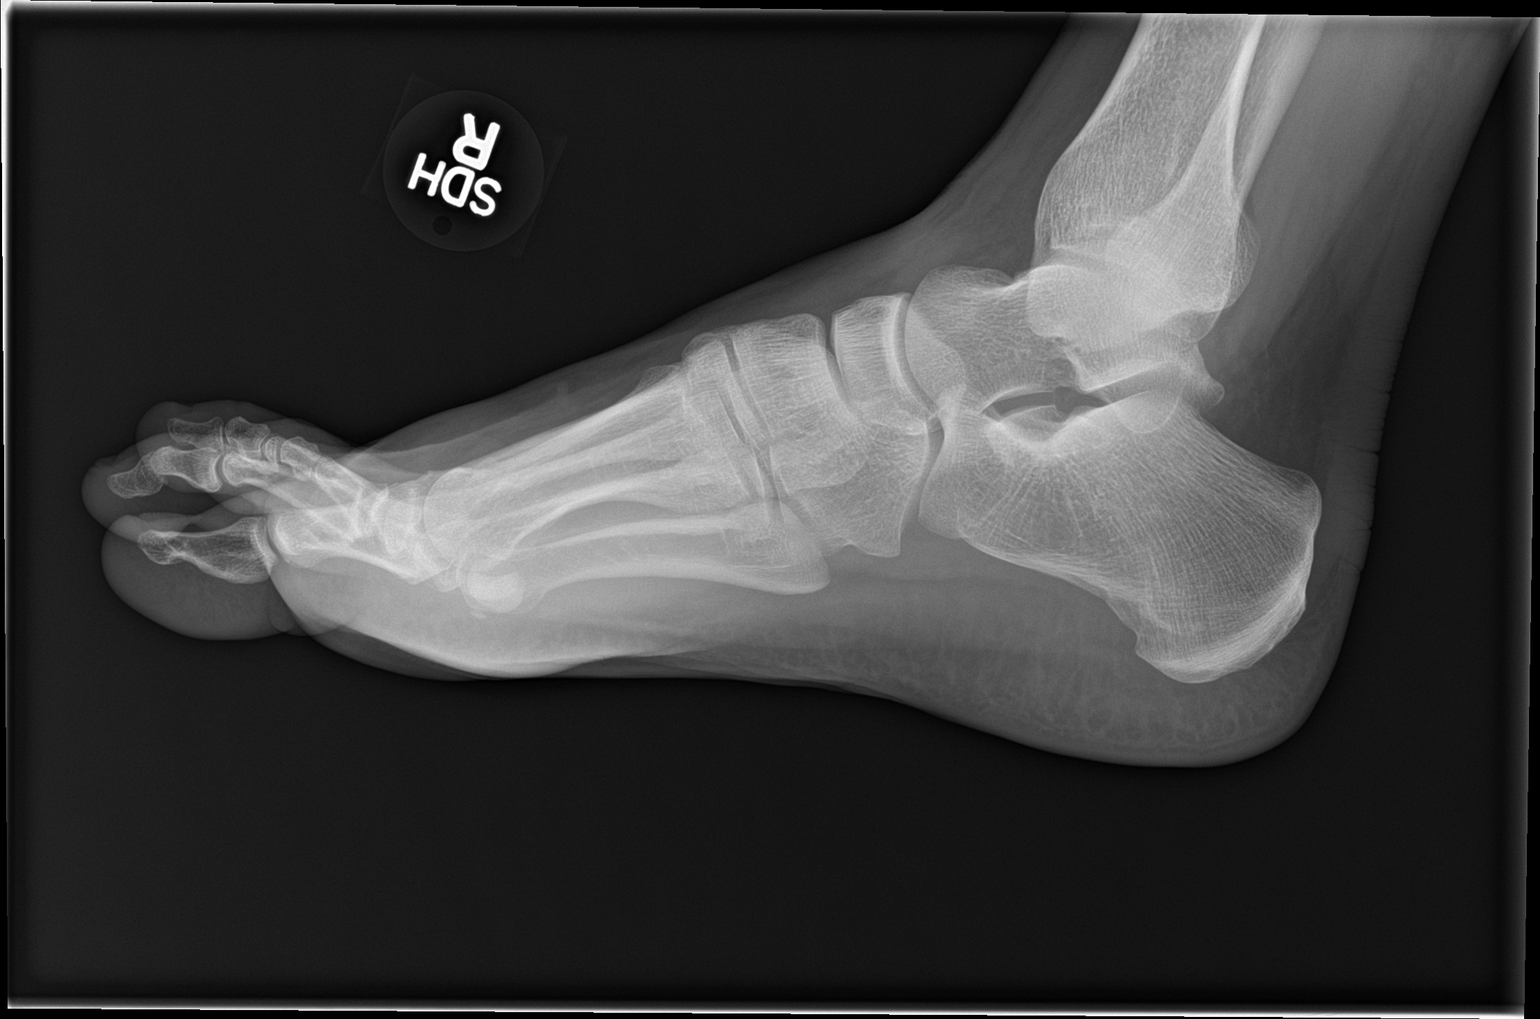

[3 of 3 positions shown; findings below may reference images not displayed]

FINDINGS: There is no evidence of fracture or dislocation. There is no
evidence of arthropathy or other focal bone abnormality. Soft
tissues are unremarkable.
IMPRESSION: Negative.

## 2018-01-02 ENCOUNTER — Encounter: Payer: Self-pay | Admitting: Emergency Medicine

## 2018-01-02 ENCOUNTER — Other Ambulatory Visit: Payer: Self-pay

## 2018-01-02 ENCOUNTER — Ambulatory Visit
Admission: EM | Admit: 2018-01-02 | Discharge: 2018-01-02 | Disposition: A | Discharge status: Home / Self Care | Payer: Managed Care, Other (non HMO) | Attending: Family Medicine | Admitting: Family Medicine

## 2018-01-02 DIAGNOSIS — R21 Rash and other nonspecific skin eruption: Secondary | ICD-10-CM

## 2018-01-02 DIAGNOSIS — L255 Unspecified contact dermatitis due to plants, except food: Secondary | ICD-10-CM

## 2018-01-02 MED ORDER — PREDNISONE 10 MG PO TABS: ORAL_TABLET | ORAL | 0 refills | Status: DC

## 2018-01-02 MED ORDER — HYDROXYZINE HCL 25 MG PO TABS: 25.0000 mg | ORAL_TABLET | Freq: Three times a day (TID) | ORAL | 0 refills | Status: DC | PRN

## 2018-01-02 NOTE — Discharge Instructions (Signed)
Meds as prescribed. ° °Take care ° °Dr. Jathan Balling  °

## 2018-01-02 NOTE — ED Triage Notes (Signed)
Patient c/o itchy rash on his neck and chest that started a week ago.

## 2018-01-02 NOTE — ED Provider Notes (Signed)
[facility]    CSN: [id number] Arrival date & time: 01/02/18  1444  History   Chief Complaint Chief Complaint  Patient presents with  . Rash   HPI   52 year old presents with rash.  1 week history of rash.  Located on the neck and anterior chest.  Itchy.  He states that he has recently been weedeating and thinks he may have come into contact with poison ivy.  He has been applying topical Benadryl as well as calamine lotion without resolution.  Severe.  No other associated symptoms.  No other complaints.  Past Medical History:  Diagnosis Date  . Stomach ulcer    2010   Past Surgical History:  Procedure Laterality Date  . CLOSED MANIPULATION SHOULDER WITH STERIOD INJECTION Right 09/20/2015   Procedure: CLOSED MANIPULATION SHOULDER WITH STEROID INJECTION;  Surgeon: Christena Flake, MD;  Location: [facility];  Service: Orthopedics;  Laterality: Right;  . CYST EXCISION     NECK  . KNEE ARTHROSCOPY Bilateral   . LASIK    . ROTATOR CUFF REPAIR    . SHOULDER ARTHROSCOPY Right 06/20/2015   Procedure: ARTHROSCOPY SHOULDER rotator cuff repair with limited debridement- right;  Surgeon: Christena Flake, MD;  Location: [facility];  Service: Orthopedics;  Laterality: Right;   Home Medications    Prior to Admission medications   Medication Sig Start Date End Date Taking? Authorizing Provider  Multiple Vitamin (MULTIVITAMIN) capsule Take 1 capsule by mouth daily.   Yes [provider]  AMERICAN GINSENG PO Take by mouth daily.    [provider]  hydrOXYzine (ATARAX/VISTARIL) 25 MG tablet Take 1 tablet (25 mg total) by mouth every 8 (eight) hours as needed. 01/02/18   Tommie Sams, DO  naproxen (NAPROSYN) 500 MG tablet Take 1 tablet (500 mg total) by mouth 2 (two) times daily with a meal. 11/06/15   Sharman Cheek, MD  predniSONE (DELTASONE) 10 MG tablet 50 mg daily x 3 days, then 40 mg daily x 3 days, then 30 mg daily x 3 days, then 20 mg daily x 3 days, then 10  mg daily x 3 days. 01/02/18   Tommie Sams, DO  Saw Palmetto, Serenoa repens, (SAW PALMETTO PO) Take by mouth daily.    [provider]   Social History Social History   Tobacco Use  . Smoking status: Never Smoker  . Smokeless tobacco: Never Used  Substance Use Topics  . Alcohol use: Yes    Alcohol/week: 8.0 standard drinks    Types: 8 Cans of beer per week    Comment:    . Drug use: No   Allergies   Patient has no known allergies.   Review of Systems Review of Systems  Constitutional: Negative.   Skin: Positive for rash.   Physical Exam Triage Vital Signs ED Triage Vitals  Enc Vitals Group     BP 01/02/18 1457 140/87     Pulse Rate 01/02/18 1457 (!) 57     Resp 01/02/18 1457 16     Temp 01/02/18 1457 98.3 F (36.8 C)     Temp Source 01/02/18 1457 Oral     SpO2 01/02/18 1457 100 %     Weight 01/02/18 1456 172 lb (78 kg)     Height 01/02/18 1456 5\' 10"  (1.778 m)     Head Circumference --      Peak Flow --      Pain Score 01/02/18 1456 0  Pain Loc --      Pain Edu? --      Excl. in GC? --    Updated Vital Signs BP 140/87 (BP Location: Left Arm)   Pulse (!) 57   Temp 98.3 F (36.8 C) (Oral)   Resp 16   Ht 5\' 10"  (1.778 m)   Wt 78 kg   SpO2 100%   BMI 24.68 kg/m   Visual Acuity Right Eye Distance:   Left Eye Distance:   Bilateral Distance:    Right Eye Near:   Left Eye Near:    Bilateral Near:     Physical Exam  Constitutional: He is oriented to person, place, and time. He appears well-developed. No distress.  HENT:  Head: Normocephalic and atraumatic.  Pulmonary/Chest: Effort normal. No respiratory distress.  Neurological: He is alert and oriented to person, place, and time.  Skin:  Right side of the neck -raised erythematous vesicular rash noted.  Psychiatric: He has a normal mood and affect. His behavior is normal.  Nursing note and vitals reviewed.  UC Treatments / Results  Labs (all labs ordered are listed, but only abnormal  results are displayed) Labs Reviewed - No data to display  EKG None  Radiology No results found.  Procedures Procedures (including critical care time)  Medications Ordered in UC Medications - No data to display  Initial Impression / Assessment and Plan / UC Course  I have reviewed the triage vital signs and the nursing notes.  Pertinent labs & imaging results that were available during my care of the patient were reviewed by me and considered in my medical decision making (see chart for details).    52 year old male presents with dermatitis secondary to poison oak or poison ivy.  Treating with prednisone.  Atarax for itching.  Final Clinical Impressions(s) / UC Diagnoses   Final diagnoses:  Dermatitis due to plants, including poison ivy, sumac, and oak     Discharge Instructions     Meds as prescribed.  Take care  Dr. Adriana Simasook    ED Prescriptions    Medication Sig Dispense Auth. Provider   predniSONE (DELTASONE) 10 MG tablet 50 mg daily x 3 days, then 40 mg daily x 3 days, then 30 mg daily x 3 days, then 20 mg daily x 3 days, then 10 mg daily x 3 days. 45 tablet Kye Silverstein G, DO   hydrOXYzine (ATARAX/VISTARIL) 25 MG tablet Take 1 tablet (25 mg total) by mouth every 8 (eight) hours as needed. 30 tablet Tommie Samsook, Creg Gilmer G, DO     Controlled Substance Prescriptions [place] Controlled Substance Registry consulted? Not Applicable   Tommie SamsCook, Daking Westervelt G, DO 01/02/18 1628

## 2018-02-20 ENCOUNTER — Other Ambulatory Visit: Payer: Self-pay

## 2018-02-20 ENCOUNTER — Ambulatory Visit
Admission: EM | Admit: 2018-02-20 | Discharge: 2018-02-20 | Disposition: A | Discharge status: Home / Self Care | Payer: Managed Care, Other (non HMO) | Attending: Family Medicine | Admitting: Family Medicine

## 2018-02-20 ENCOUNTER — Encounter: Payer: Self-pay | Admitting: Emergency Medicine

## 2018-02-20 DIAGNOSIS — M109 Gout, unspecified: Secondary | ICD-10-CM

## 2018-02-20 MED ORDER — PREDNISONE 10 MG PO TABS: ORAL_TABLET | ORAL | 0 refills | Status: DC

## 2018-02-20 NOTE — ED Provider Notes (Signed)
[facility]    CSN: [id number] Arrival date & time: 02/20/18  0806  History   Chief Complaint Chief Complaint  Patient presents with  . Toe Pain   HPI  52 year old male with a reported history of gout presents with suspected gout.  Patient reports that he is on day 2 of a suspected acute gout flare.  He reports right great toe pain, erythema, swelling.  Severe.  Interfering with sleep.  Worse with range of motion.  He has been taking over-the-counter ibuprofen without relief.  No reported injury.  No other associated symptoms.  No other complaints.  Past Medical History:  Diagnosis Date  . Stomach ulcer    2010   Past Surgical History:  Procedure Laterality Date  . CLOSED MANIPULATION SHOULDER WITH STERIOD INJECTION Right 09/20/2015   Procedure: CLOSED MANIPULATION SHOULDER WITH STEROID INJECTION;  Surgeon: Christena Flake, MD;  Location: [facility];  Service: Orthopedics;  Laterality: Right;  . CYST EXCISION     NECK  . KNEE ARTHROSCOPY Bilateral   . LASIK    . ROTATOR CUFF REPAIR    . SHOULDER ARTHROSCOPY Right 06/20/2015   Procedure: ARTHROSCOPY SHOULDER rotator cuff repair with limited debridement- right;  Surgeon: Christena Flake, MD;  Location: [facility];  Service: Orthopedics;  Laterality: Right;    Home Medications    Prior to Admission medications   Medication Sig Start Date End Date Taking? Authorizing Provider  Multiple Vitamin (MULTIVITAMIN) capsule Take 1 capsule by mouth daily.   Yes [provider]  Saw Palmetto, Serenoa repens, (SAW PALMETTO PO) Take by mouth daily.   Yes [provider]  AMERICAN GINSENG PO Take by mouth daily.    [provider]  predniSONE (DELTASONE) 10 MG tablet 50 mg daily x 3 days, then 40 mg daily x 3 days, then 30 mg daily x 3 days, then 20 mg daily x 3 days, then 10 mg daily x 3 days. 02/20/18   Tommie Sams, DO   Social History Social History   Tobacco Use  . Smoking status: Never  Smoker  . Smokeless tobacco: Never Used  Substance Use Topics  . Alcohol use: Yes    Alcohol/week: 8.0 standard drinks    Types: 8 Cans of beer per week    Comment:    . Drug use: No   Allergies   Patient has no known allergies.  Review of Systems Review of Systems  Constitutional: Negative.   Musculoskeletal:       Right great toe pain, swelling, erythema.   Physical Exam Triage Vital Signs ED Triage Vitals  Enc Vitals Group     BP 02/20/18 0823 122/83     Pulse Rate 02/20/18 0823 60     Resp 02/20/18 0823 16     Temp 02/20/18 0823 98.1 F (36.7 C)     Temp Source 02/20/18 0823 Oral     SpO2 02/20/18 0823 99 %     Weight 02/20/18 0820 175 lb (79.4 kg)     Height 02/20/18 0820 5\' 10"  (1.778 m)     Head Circumference --      Peak Flow --      Pain Score 02/20/18 0820 9     Pain Loc --      Pain Edu? --      Excl. in GC? --    Updated Vital Signs BP 122/83 (BP Location: Left Arm)   Pulse 60   Temp  98.1 F (36.7 C) (Oral)   Resp 16   Ht 5\' 10"  (1.778 m)   Wt 79.4 kg   SpO2 99%   BMI 25.11 kg/m   Visual Acuity Right Eye Distance:   Left Eye Distance:   Bilateral Distance:    Right Eye Near:   Left Eye Near:    Bilateral Near:     Physical Exam  Constitutional: He is oriented to person, place, and time. He appears well-developed. No distress.  Cardiovascular: Normal rate and regular rhythm.  Pulmonary/Chest: Effort normal and breath sounds normal. He has no wheezes. He has no rales.  Musculoskeletal:  Right great toe -erythema, exquisite tenderness to palpation at the MTP joint, mild to moderate swelling.  Neurological: He is alert and oriented to person, place, and time.  Psychiatric: He has a normal mood and affect. His behavior is normal.  Nursing note and vitals reviewed.  UC Treatments / Results  Labs (all labs ordered are listed, but only abnormal results are displayed) Labs Reviewed - No data to display  EKG None  Radiology No results  found.  Procedures Procedures (including critical care time)  Medications Ordered in UC Medications - No data to display  Initial Impression / Assessment and Plan / UC Course  I have reviewed the triage vital signs and the nursing notes.  Pertinent labs & imaging results that were available during my care of the patient were reviewed by me and considered in my medical decision making (see chart for details).    52 year old male presents with a gout flare.  Treating with prednisone.  Advised to see his primary at [facility] for follow-up and initiation of urate lowering therapy.  Final Clinical Impressions(s) / UC Diagnoses   Final diagnoses:  Acute gout involving toe of right foot, unspecified cause   Discharge Instructions   None    ED Prescriptions    Medication Sig Dispense Auth. Provider   predniSONE (DELTASONE) 10 MG tablet 50 mg daily x 3 days, then 40 mg daily x 3 days, then 30 mg daily x 3 days, then 20 mg daily x 3 days, then 10 mg daily x 3 days. 45 tablet Tommie Sams, DO     Controlled Substance Prescriptions [place] Controlled Substance Registry consulted? Not Applicable   Tommie Sams, [place] 02/20/18 [phone]

## 2018-02-20 NOTE — ED Triage Notes (Signed)
Patient c/o pain in his right 1st toe that started two days ago.  Patient reports history of gout.

## 2024-04-30 ENCOUNTER — Other Ambulatory Visit: Payer: Self-pay | Admitting: Surgery

## 2024-04-30 DIAGNOSIS — M1712 Unilateral primary osteoarthritis, left knee: Secondary | ICD-10-CM

## 2024-04-30 DIAGNOSIS — S83232D Complex tear of medial meniscus, current injury, left knee, subsequent encounter: Secondary | ICD-10-CM

## 2024-05-06 ENCOUNTER — Ambulatory Visit: Admission: RE | Admit: 2024-05-06 | Discharge: 2024-05-06 | Attending: Surgery

## 2024-05-06 DIAGNOSIS — S83232D Complex tear of medial meniscus, current injury, left knee, subsequent encounter: Secondary | ICD-10-CM | POA: Diagnosis present

## 2024-05-06 DIAGNOSIS — M1712 Unilateral primary osteoarthritis, left knee: Secondary | ICD-10-CM | POA: Insufficient documentation

## 2024-06-15 ENCOUNTER — Other Ambulatory Visit: Payer: Self-pay | Admitting: Surgery

## 2024-06-18 ENCOUNTER — Other Ambulatory Visit: Payer: Self-pay

## 2024-06-18 ENCOUNTER — Encounter
Admission: RE | Admit: 2024-06-18 | Discharge: 2024-06-18 | Disposition: A | Source: Ambulatory Visit | Attending: Surgery

## 2024-06-18 VITALS — BP 120/80 | Resp 14 | Ht 70.0 in | Wt 185.0 lb

## 2024-06-18 DIAGNOSIS — Z01818 Encounter for other preprocedural examination: Secondary | ICD-10-CM | POA: Insufficient documentation

## 2024-06-18 HISTORY — DX: Unilateral primary osteoarthritis, left knee: M17.12

## 2024-06-18 HISTORY — DX: Depression, unspecified: F32.A

## 2024-06-18 HISTORY — DX: Prediabetes: R73.03

## 2024-06-18 HISTORY — DX: Hyperlipidemia, unspecified: E78.5

## 2024-06-18 HISTORY — DX: Gout, unspecified: M10.9

## 2024-06-18 LAB — CBC WITH DIFFERENTIAL/PLATELET
Abs Immature Granulocytes: 0.01 K/uL (ref 0.00–0.07)
Basophils Absolute: 0 K/uL (ref 0.0–0.1)
Basophils Relative: 1 %
Eosinophils Absolute: 0.2 K/uL (ref 0.0–0.5)
Eosinophils Relative: 3 %
HCT: 44.3 % (ref 39.0–52.0)
Hemoglobin: 15.4 g/dL (ref 13.0–17.0)
Immature Granulocytes: 0 %
Lymphocytes Relative: 28 %
Lymphs Abs: 2.1 K/uL (ref 0.7–4.0)
MCH: 30.1 pg (ref 26.0–34.0)
MCHC: 34.8 g/dL (ref 30.0–36.0)
MCV: 86.5 fL (ref 80.0–100.0)
Monocytes Absolute: 0.8 K/uL (ref 0.1–1.0)
Monocytes Relative: 11 %
Neutro Abs: 4.5 K/uL (ref 1.7–7.7)
Neutrophils Relative %: 57 %
Platelets: 290 K/uL (ref 150–400)
RBC: 5.12 MIL/uL (ref 4.22–5.81)
RDW: 12.5 % (ref 11.5–15.5)
WBC: 7.8 K/uL (ref 4.0–10.5)
nRBC: 0 % (ref 0.0–0.2)

## 2024-06-18 LAB — URINALYSIS, ROUTINE W REFLEX MICROSCOPIC
Bilirubin Urine: NEGATIVE
Glucose, UA: NEGATIVE mg/dL
Hgb urine dipstick: NEGATIVE
Ketones, ur: NEGATIVE mg/dL
Leukocytes,Ua: NEGATIVE
Nitrite: NEGATIVE
Protein, ur: NEGATIVE mg/dL
Specific Gravity, Urine: 1.024 (ref 1.005–1.030)
pH: 5 (ref 5.0–8.0)

## 2024-06-18 LAB — COMPREHENSIVE METABOLIC PANEL WITH GFR
ALT: 25 U/L (ref 0–44)
AST: 25 U/L (ref 15–41)
Albumin: 4.9 g/dL (ref 3.5–5.0)
Alkaline Phosphatase: 70 U/L (ref 38–126)
Anion gap: 13 (ref 5–15)
BUN: 17 mg/dL (ref 6–20)
CO2: 21 mmol/L — ABNORMAL LOW (ref 22–32)
Calcium: 9.4 mg/dL (ref 8.9–10.3)
Chloride: 105 mmol/L (ref 98–111)
Creatinine, Ser: 0.88 mg/dL (ref 0.61–1.24)
GFR, Estimated: >60 mL/min
Glucose, Bld: 71 mg/dL (ref 70–99)
Potassium: 4 mmol/L (ref 3.5–5.1)
Sodium: 140 mmol/L (ref 135–145)
Total Bilirubin: 0.9 mg/dL (ref 0.0–1.2)
Total Protein: 7.1 g/dL (ref 6.5–8.1)

## 2024-06-18 LAB — SURGICAL PCR SCREEN
MRSA, PCR: NEGATIVE
Staphylococcus aureus: POSITIVE — AB

## 2024-06-18 NOTE — Patient Instructions (Addendum)
 Your procedure is scheduled on:06-29-24 Tuesday Report to the Registration Desk on the 1st floor of the [facility].Then proceed to the 2nd floor Surgery Desk To find out your arrival time, please call [phone] between 1PM - 3PM on:06-28-24 Monday If your arrival time is 6:00 am, do not arrive before that time as the [facility] entrance doors do not open until 6:00 am.  REMEMBER: Instructions that are not followed completely may result in serious medical risk, up to and including death; or upon the discretion of your surgeon and anesthesiologist your surgery may need to be rescheduled.  Do not eat food after midnight the night before surgery.  No gum chewing or hard candies.  You may however, drink CLEAR liquids up to 2 hours before you are scheduled to arrive for your surgery. Do not drink anything within 2 hours of your scheduled arrival time.  Clear liquids include: - water  - apple juice without pulp - gatorade (not RED colors) - black coffee or tea (Do NOT add milk or creamers to the coffee or tea) Do NOT drink anything that is not on this list.  In addition, your doctor has ordered for you to drink the provided:  Ensure Pre-Surgery Clear Carbohydrate Drink  Drinking this carbohydrate drink up to two hours before surgery helps to reduce insulin resistance and improve patient outcomes. Please complete drinking 2 hours before scheduled arrival time.  One week prior to surgery:Last dose will be on 06-21-24 Monday Stop Anti-inflammatories (NSAIDS) such as Advil, Aleve , Ibuprofen, Motrin, Naproxen , Naprosyn  and Aspirin based products such as Excedrin, Goody's Powder, BC Powder. Stop ANY OVER THE COUNTER supplements until after surgery (Multivitamin)  You may however, continue to take Tylenol  if needed for pain up until the day of surgery.  Continue taking all of your other prescription medications up until the day of surgery.  Do NOT take any medication the day of surgery  No  Alcohol for 24 hours before or after surgery.  No Smoking including e-cigarettes for 24 hours before surgery.  No chewable tobacco products for at least 6 hours before surgery.  No nicotine patches on the day of surgery.  Do not use any recreational drugs for at least a week (preferably 2 weeks) before your surgery.  Please be advised that the combination of cocaine and anesthesia may have negative outcomes, up to and including death. If you test positive for cocaine, your surgery will be cancelled.  On the morning of surgery brush your teeth with toothpaste and water, you may rinse your mouth with mouthwash if you wish. Do not swallow any toothpaste or mouthwash.  Use CHG Soap as directed on instruction sheet.  Do not wear jewelry, make-up, hairpins, clips or nail polish.  For welded (permanent) jewelry: bracelets, anklets, waist bands, etc.  Please have this removed prior to surgery.  If it is not removed, there is a chance that hospital personnel will need to cut it off on the day of surgery.  Do not wear lotions, powders, or perfumes.   Do not shave body hair from the neck down 48 hours before surgery.  Contact lenses, hearing aids and dentures may not be worn into surgery.  Do not bring valuables to the hospital. [facility] is not responsible for any missing/lost belongings or valuables.   Notify your doctor if there is any change in your medical condition (cold, fever, infection).  Wear comfortable clothing (specific to your surgery type) to the hospital.  After surgery,  you can help prevent lung complications by doing breathing exercises.  Take deep breaths and cough every 1-2 hours. Your doctor may order a device called an Incentive Spirometer to help you take deep breaths. When coughing or sneezing, hold a pillow firmly against your incision with both hands. This is called splinting. Doing this helps protect your incision. It also decreases belly discomfort.  If you  are being admitted to the hospital overnight, leave your suitcase in the car. After surgery it may be brought to your room.  In case of increased patient census, it may be necessary for you, the patient, to continue your postoperative care in the Same Day Surgery department.  If you are being discharged the day of surgery, you will not be allowed to drive home. You will need a responsible individual to drive you home and stay with you for 24 hours after surgery.   If you are taking public transportation, you will need to have a responsible individual with you.  Please call the Pre-admissions Testing Dept. at [phone] if you have any questions about these instructions.  Surgery Visitation Policy:  Patients having surgery or a procedure may have two visitors.  Children under the age of 34 must have an adult with them who is not the patient.  Inpatient Visitation:    Visiting hours are 7 a.m. to 8 p.m. Up to four visitors are allowed at one time in a patient room. The visitors may rotate out with other people during the day.  One visitor age 72 or older may stay with the patient overnight and must be in the room by 8 p.m.    Pre-operative 4 CHG Bath Instructions   You can play a key role in reducing the risk of infection after surgery. Your skin needs to be as free of germs as possible. You can reduce the number of germs on your skin by washing with CHG (chlorhexidine gluconate) soap before surgery. CHG is an antiseptic soap that kills germs and continues to kill germs even after washing.   DO NOT use if you have an allergy to chlorhexidine/CHG or antibacterial soaps. If your skin becomes reddened or irritated, stop using the CHG and notify one of our RNs at [phone].   Please shower with the CHG soap starting 4 days before surgery using the following schedule:     Please keep in mind the following:  DO NOT shave, including legs and underarms, starting the day of your first  shower.   You may shave your face at any point before/day of surgery.  Place clean sheets on your bed the day you start using CHG soap. Use a clean washcloth (not used since being washed) for each shower. DO NOT sleep with pets once you start using the CHG.   CHG Shower Instructions:  If you choose to wash your hair and private area, wash first with your normal shampoo/soap.  After you use shampoo/soap, rinse your hair and body thoroughly to remove shampoo/soap residue.  Turn the water OFF and apply about 3 tablespoons (45 ml) of CHG soap to a CLEAN washcloth.  Apply CHG soap ONLY FROM YOUR NECK DOWN TO YOUR TOES (washing for 3-5 minutes)  DO NOT use CHG soap on face, private areas, open wounds, or sores.  Pay special attention to the area where your surgery is being performed.  If you are having back surgery, having someone wash your back for you may be helpful. Wait 2 minutes after CHG  soap is applied, then you may rinse off the CHG soap.  Pat dry with a clean towel  Put on clean clothes/pajamas   If you choose to wear lotion, please use ONLY the CHG-compatible lotions on the back of this paper.     Additional instructions for the day of surgery: DO NOT APPLY any lotions, deodorants, cologne, or perfumes.   Put on clean/comfortable clothes.  Brush your teeth.  Ask your nurse before applying any prescription medications to the skin.      CHG Compatible Lotions   Aveeno Moisturizing lotion  Cetaphil Moisturizing Cream  Cetaphil Moisturizing Lotion  Clairol Herbal Essence Moisturizing Lotion, Dry Skin  Clairol Herbal Essence Moisturizing Lotion, Extra Dry Skin  Clairol Herbal Essence Moisturizing Lotion, Normal Skin  Curel Age Defying Therapeutic Moisturizing Lotion with Alpha Hydroxy  Curel Extreme Care Body Lotion  Curel Soothing Hands Moisturizing Hand Lotion  Curel Therapeutic Moisturizing Cream, Fragrance-Free  Curel Therapeutic Moisturizing Lotion, Fragrance-Free  Curel  Therapeutic Moisturizing Lotion, Original Formula  Eucerin Daily Replenishing Lotion  Eucerin Dry Skin Therapy Plus Alpha Hydroxy Crme  Eucerin Dry Skin Therapy Plus Alpha Hydroxy Lotion  Eucerin Original Crme  Eucerin Original Lotion  Eucerin Plus Crme Eucerin Plus Lotion  Eucerin TriLipid Replenishing Lotion  Keri Anti-Bacterial Hand Lotion  Keri Deep Conditioning Original Lotion Dry Skin Formula Softly Scented  Keri Deep Conditioning Original Lotion, Fragrance Free Sensitive Skin Formula  Keri Lotion Fast Absorbing Fragrance Free Sensitive Skin Formula  Keri Lotion Fast Absorbing Softly Scented Dry Skin Formula  Keri Original Lotion  Keri Skin Renewal Lotion Keri Silky Smooth Lotion  Keri Silky Smooth Sensitive Skin Lotion  Nivea Body Creamy Conditioning Oil  Nivea Body Extra Enriched Lotion  Nivea Body Original Lotion  Nivea Body Sheer Moisturizing Lotion Nivea Crme  Nivea Skin Firming Lotion  NutraDerm 30 Skin Lotion  NutraDerm Skin Lotion  NutraDerm Therapeutic Skin Cream  NutraDerm Therapeutic Skin Lotion  ProShield Protective Hand Cream  Provon moisturizing lotion  How to Use an Incentive Spirometer An incentive spirometer is a tool that measures how well you are filling your lungs with each breath. Learning to take long, deep breaths using this tool can help you keep your lungs clear and active. This may help to reverse or lessen your chance of developing breathing (pulmonary) problems, especially infection. You may be asked to use a spirometer: After a surgery. If you have a lung problem or a history of smoking. After a long period of time when you have been unable to move or be active. If the spirometer includes an indicator to show the highest number that you have reached, your health care provider or respiratory therapist will help you set a goal. Keep a log of your progress as told by your health care provider. What are the risks? Breathing too quickly may cause  dizziness or cause you to pass out. Take your time so you do not get dizzy or light-headed. If you are in pain, you may need to take pain medicine before doing incentive spirometry. It is harder to take a deep breath if you are having pain. How to use your incentive spirometer  Sit up on the edge of your bed or on a chair. Hold the incentive spirometer so that it is in an upright position. Before you use the spirometer, breathe out normally. Place the mouthpiece in your mouth. Make sure your lips are closed tightly around it. Breathe in slowly and as deeply as you can  through your mouth, causing the piston or the ball to rise toward the top of the chamber. Hold your breath for 3-5 seconds, or for as long as possible. If the spirometer includes a coach indicator, use this to guide you in breathing. Slow down your breathing if the indicator goes above the marked areas. Remove the mouthpiece from your mouth and breathe out normally. The piston or ball will return to the bottom of the chamber. Rest for a few seconds, then repeat the steps 10 or more times. Take your time and take a few normal breaths between deep breaths so that you do not get dizzy or light-headed. Do this every 1-2 hours when you are awake. If the spirometer includes a goal marker to show the highest number you have reached (best effort), use this as a goal to work toward during each repetition. After each set of 10 deep breaths, cough a few times. This will help to make sure that your lungs are clear. If you have an incision on your chest or abdomen from surgery, place a pillow or a rolled-up towel firmly against the incision when you cough. This can help to reduce pain while taking deep breaths and coughing. General tips When you are able to get out of bed: Walk around often. Continue to take deep breaths and cough in order to clear your lungs. Keep using the incentive spirometer until your health care provider says it is okay  to stop using it. If you have been in the hospital, you may be told to keep using the spirometer at home. Contact a health care provider if: You are having difficulty using the spirometer. You have trouble using the spirometer as often as instructed. Your pain medicine is not giving enough relief for you to use the spirometer as told. You have a fever. Get help right away if: You develop shortness of breath. You develop a cough with bloody mucus from the lungs. You have fluid or blood coming from an incision site after you cough. Summary An incentive spirometer is a tool that can help you learn to take long, deep breaths to keep your lungs clear and active. You may be asked to use a spirometer after a surgery, if you have a lung problem or a history of smoking, or if you have been inactive for a long period of time. Use your incentive spirometer as instructed every 1-2 hours while you are awake. If you have an incision on your chest or abdomen, place a pillow or a rolled-up towel firmly against your incision when you cough. This will help to reduce pain. Get help right away if you have shortness of breath, you cough up bloody mucus, or blood comes from your incision when you cough. This information is not intended to replace advice given to you by your health care provider. Make sure you discuss any questions you have with your health care provider. Document Revised: 03/21/2023 Document Reviewed: 03/21/2023 Elsevier Patient Education  2024 Elsevier Inc.   Preoperative Educational Videos for Total Hip, Knee and Shoulder Replacements  To better prepare for surgery, please view our videos that explain the physical activity and discharge planning required to have the best surgical recovery at [facility].  [email or web address]  Questions? Call [phone] or email  [email or web address]       Community Resource Directory to address health-related social needs:  [email or web address]

## 2024-06-29 ENCOUNTER — Encounter: Payer: Self-pay | Admitting: Surgery

## 2024-06-29 ENCOUNTER — Ambulatory Visit

## 2024-06-29 ENCOUNTER — Ambulatory Visit: Admission: RE | Admit: 2024-06-29 | Discharge: 2024-06-30 | Disposition: A | Attending: Surgery | Admitting: Surgery

## 2024-06-29 ENCOUNTER — Encounter: Admission: RE | Disposition: A | Payer: Self-pay | Source: Home / Self Care | Attending: Surgery

## 2024-06-29 ENCOUNTER — Other Ambulatory Visit: Payer: Self-pay

## 2024-06-29 DIAGNOSIS — Z96652 Presence of left artificial knee joint: Secondary | ICD-10-CM

## 2024-06-29 DIAGNOSIS — M1712 Unilateral primary osteoarthritis, left knee: Secondary | ICD-10-CM | POA: Insufficient documentation

## 2024-06-29 MED ORDER — FENTANYL CITRATE (PF) 100 MCG/2ML IJ SOLN
INTRAMUSCULAR | Status: DC | PRN
  Administered 2024-06-29: 50 ug via INTRAVENOUS
  Administered 2024-06-29 (×2): 25 ug via INTRAVENOUS

## 2024-06-29 MED ORDER — ALLOPURINOL 100 MG PO TABS
100.0000 mg | ORAL_TABLET | Freq: Every day | ORAL | Status: DC
  Administered 2024-06-29: 100 mg via ORAL
  Filled 2024-06-29: qty 1

## 2024-06-29 MED ORDER — BUPIVACAINE HCL (PF) 0.5 % IJ SOLN
INTRAMUSCULAR | Status: DC | PRN
  Administered 2024-06-29: 3 mL

## 2024-06-29 MED ORDER — MIDAZOLAM HCL 5 MG/5ML IJ SOLN
INTRAMUSCULAR | Status: DC | PRN
  Administered 2024-06-29 (×2): 1 mg via INTRAVENOUS

## 2024-06-29 MED ORDER — FENTANYL CITRATE (PF) 100 MCG/2ML IJ SOLN: 25.0000 ug | INTRAMUSCULAR | Status: DC | PRN

## 2024-06-29 MED ORDER — SERTRALINE HCL 50 MG PO TABS
150.0000 mg | ORAL_TABLET | Freq: Every day | ORAL | Status: DC
  Administered 2024-06-29: 150 mg via ORAL
  Filled 2024-06-29: qty 3

## 2024-06-29 MED ORDER — MUPIROCIN 2 % EX OINT: 1.0000 | TOPICAL_OINTMENT | Freq: Two times a day (BID) | CUTANEOUS | 0 refills | Status: DC

## 2024-06-29 MED ORDER — PRAVASTATIN SODIUM 20 MG PO TABS
40.0000 mg | ORAL_TABLET | Freq: Every day | ORAL | Status: DC
  Administered 2024-06-29: 40 mg via ORAL
  Filled 2024-06-29: qty 2

## 2024-06-29 MED ORDER — SODIUM CHLORIDE (PF) 0.9 % IJ SOLN
INTRAMUSCULAR | Status: AC
  Filled 2024-06-29: qty 40

## 2024-06-29 MED ORDER — SODIUM CHLORIDE 0.9 % BOLUS PEDS
250.0000 mL | Freq: Once | INTRAVENOUS | Status: AC
  Administered 2024-06-29: 250 mL via INTRAVENOUS

## 2024-06-29 MED ORDER — BUPIVACAINE-EPINEPHRINE (PF) 0.5% -1:200000 IJ SOLN
INTRAMUSCULAR | Status: AC
  Filled 2024-06-29: qty 30

## 2024-06-29 MED ORDER — PROPOFOL 500 MG/50ML IV EMUL
INTRAVENOUS | Status: DC | PRN
  Administered 2024-06-29: 80 ug/kg/min via INTRAVENOUS

## 2024-06-29 MED ORDER — SODIUM CHLORIDE 0.9 % IR SOLN
Status: DC | PRN
  Administered 2024-06-29: 3000 mL

## 2024-06-29 MED ORDER — KETOROLAC TROMETHAMINE 30 MG/ML IJ SOLN
INTRAMUSCULAR | Status: AC
  Filled 2024-06-29: qty 1

## 2024-06-29 MED ORDER — DEXMEDETOMIDINE HCL IN NACL 80 MCG/20ML IV SOLN
INTRAVENOUS | Status: AC
  Filled 2024-06-29: qty 20

## 2024-06-29 MED ORDER — BUPIVACAINE-EPINEPHRINE (PF) 0.5% -1:200000 IJ SOLN
INTRAMUSCULAR | Status: DC | PRN
  Administered 2024-06-29: 30 mL

## 2024-06-29 MED ORDER — OXYCODONE HCL 5 MG PO TABS: 5.0000 mg | ORAL_TABLET | ORAL | Status: DC | PRN

## 2024-06-29 MED ORDER — LACTATED RINGERS IV SOLN: INTRAVENOUS | Status: DC

## 2024-06-29 MED ORDER — ONDANSETRON HCL 4 MG/2ML IJ SOLN: 4.0000 mg | Freq: Four times a day (QID) | INTRAMUSCULAR | Status: DC | PRN

## 2024-06-29 MED ORDER — TRANEXAMIC ACID-NACL 1000-0.7 MG/100ML-% IV SOLN
INTRAVENOUS | Status: AC
  Filled 2024-06-29: qty 100

## 2024-06-29 MED ORDER — OXYCODONE HCL 5 MG PO TABS
5.0000 mg | ORAL_TABLET | Freq: Once | ORAL | Status: AC | PRN
  Administered 2024-06-29: 5 mg via ORAL

## 2024-06-29 MED ORDER — APIXABAN 2.5 MG PO TABS
2.5000 mg | ORAL_TABLET | Freq: Two times a day (BID) | ORAL | Status: DC
  Administered 2024-06-30: 2.5 mg via ORAL
  Filled 2024-06-29: qty 1

## 2024-06-29 MED ORDER — OXYCODONE HCL 5 MG/5ML PO SOLN: 5.0000 mg | Freq: Once | ORAL | Status: AC | PRN

## 2024-06-29 MED ORDER — DEXAMETHASONE SOD PHOSPHATE PF 10 MG/ML IJ SOLN
INTRAMUSCULAR | Status: AC
  Filled 2024-06-29: qty 1

## 2024-06-29 MED ORDER — METOCLOPRAMIDE HCL 5 MG/ML IJ SOLN: 5.0000 mg | Freq: Three times a day (TID) | INTRAMUSCULAR | Status: DC | PRN

## 2024-06-29 MED ORDER — CHLORHEXIDINE GLUCONATE 0.12 % MT SOLN
15.0000 mL | Freq: Once | OROMUCOSAL | Status: AC
  Administered 2024-06-29: 15 mL via OROMUCOSAL

## 2024-06-29 MED ORDER — DEXAMETHASONE SOD PHOSPHATE PF 10 MG/ML IJ SOLN
INTRAMUSCULAR | Status: DC | PRN
  Administered 2024-06-29: 10 mg via INTRAVENOUS

## 2024-06-29 MED ORDER — FLEET ENEMA RE ENEM: 1.0000 | ENEMA | Freq: Once | RECTAL | Status: DC | PRN

## 2024-06-29 MED ORDER — HYDROMORPHONE HCL 1 MG/ML IJ SOLN: 0.2500 mg | INTRAMUSCULAR | Status: DC | PRN

## 2024-06-29 MED ORDER — KETOROLAC TROMETHAMINE 30 MG/ML IJ SOLN
INTRAMUSCULAR | Status: DC | PRN
  Administered 2024-06-29: 30 mg via INTRAVENOUS

## 2024-06-29 MED ORDER — ADULT MULTIVITAMIN W/MINERALS CH
1.0000 | ORAL_TABLET | Freq: Every day | ORAL | Status: DC
  Administered 2024-06-30: 1 via ORAL
  Filled 2024-06-29: qty 1

## 2024-06-29 MED ORDER — APIXABAN 2.5 MG PO TABS: 2.5000 mg | ORAL_TABLET | Freq: Two times a day (BID) | ORAL | 0 refills | Status: AC

## 2024-06-29 MED ORDER — ORAL CARE MOUTH RINSE: 15.0000 mL | Freq: Once | OROMUCOSAL | Status: AC

## 2024-06-29 MED ORDER — KETOROLAC TROMETHAMINE 30 MG/ML IJ SOLN
30.0000 mg | Freq: Once | INTRAMUSCULAR | Status: AC
  Administered 2024-06-29: 30 mg via INTRAVENOUS

## 2024-06-29 MED ORDER — SODIUM CHLORIDE 0.9 % IV SOLN
INTRAVENOUS | Status: DC | PRN
  Administered 2024-06-29: 60 mL

## 2024-06-29 MED ORDER — CHLORHEXIDINE GLUCONATE 4 % EX SOLN: 1.0000 | CUTANEOUS | 1 refills | Status: DC

## 2024-06-29 MED ORDER — TRIAMCINOLONE ACETONIDE 40 MG/ML IJ SUSP
INTRAMUSCULAR | Status: AC
  Filled 2024-06-29: qty 2

## 2024-06-29 MED ORDER — BUPIVACAINE HCL (PF) 0.5 % IJ SOLN
INTRAMUSCULAR | Status: AC
  Filled 2024-06-29: qty 10

## 2024-06-29 MED ORDER — METOCLOPRAMIDE HCL 10 MG PO TABS: 5.0000 mg | ORAL_TABLET | Freq: Three times a day (TID) | ORAL | Status: DC | PRN

## 2024-06-29 MED ORDER — OXYCODONE HCL 5 MG PO TABS
ORAL_TABLET | ORAL | Status: AC
  Filled 2024-06-29: qty 1

## 2024-06-29 MED ORDER — KETOROLAC TROMETHAMINE 15 MG/ML IJ SOLN
15.0000 mg | Freq: Four times a day (QID) | INTRAMUSCULAR | Status: DC
  Administered 2024-06-29 – 2024-06-30 (×3): 15 mg via INTRAVENOUS
  Filled 2024-06-29 (×2): qty 1

## 2024-06-29 MED ORDER — DIPHENHYDRAMINE HCL 12.5 MG/5ML PO ELIX: 12.5000 mg | ORAL_SOLUTION | ORAL | Status: DC | PRN

## 2024-06-29 MED ORDER — GLYCOPYRROLATE 0.2 MG/ML IJ SOLN
INTRAMUSCULAR | Status: DC | PRN
  Administered 2024-06-29: .2 mg via INTRAVENOUS

## 2024-06-29 MED ORDER — MAGNESIUM HYDROXIDE 400 MG/5ML PO SUSP: 30.0000 mL | Freq: Every day | ORAL | Status: DC | PRN

## 2024-06-29 MED ORDER — TRANEXAMIC ACID-NACL 1000-0.7 MG/100ML-% IV SOLN
INTRAVENOUS | Status: DC | PRN
  Administered 2024-06-29: 1000 mg via INTRAVENOUS

## 2024-06-29 MED ORDER — PROPOFOL 10 MG/ML IV BOLUS
INTRAVENOUS | Status: AC
  Filled 2024-06-29: qty 20

## 2024-06-29 MED ORDER — EPHEDRINE SULFATE-NACL 50-0.9 MG/10ML-% IV SOSY
PREFILLED_SYRINGE | INTRAVENOUS | Status: DC | PRN
  Administered 2024-06-29 (×3): 5 mg via INTRAVENOUS

## 2024-06-29 MED ORDER — ONDANSETRON HCL 4 MG PO TABS: 4.0000 mg | ORAL_TABLET | Freq: Four times a day (QID) | ORAL | Status: DC | PRN

## 2024-06-29 MED ORDER — LIDOCAINE HCL (CARDIAC) PF 100 MG/5ML IV SOSY
PREFILLED_SYRINGE | INTRAVENOUS | Status: DC | PRN
  Administered 2024-06-29: 100 mg via INTRAVENOUS

## 2024-06-29 MED ORDER — ACETAMINOPHEN 325 MG PO TABS: 325.0000 mg | ORAL_TABLET | Freq: Four times a day (QID) | ORAL | Status: DC | PRN

## 2024-06-29 MED ORDER — MIDAZOLAM HCL 2 MG/2ML IJ SOLN
INTRAMUSCULAR | Status: AC
  Filled 2024-06-29: qty 2

## 2024-06-29 MED ORDER — PROPOFOL 1000 MG/100ML IV EMUL
INTRAVENOUS | Status: AC
  Filled 2024-06-29: qty 100

## 2024-06-29 MED ORDER — CEFAZOLIN SODIUM-DEXTROSE 2-4 GM/100ML-% IV SOLN
2.0000 g | INTRAVENOUS | Status: AC
  Administered 2024-06-29: 2 g via INTRAVENOUS

## 2024-06-29 MED ORDER — SODIUM CHLORIDE 0.9 % IV SOLN: INTRAVENOUS | Status: AC

## 2024-06-29 MED ORDER — BUPIVACAINE LIPOSOME 1.3 % IJ SUSP
INTRAMUSCULAR | Status: AC
  Filled 2024-06-29: qty 20

## 2024-06-29 MED ORDER — ACETAMINOPHEN 10 MG/ML IV SOLN
INTRAVENOUS | Status: DC | PRN
  Administered 2024-06-29: 1000 mg via INTRAVENOUS

## 2024-06-29 MED ORDER — PROPOFOL 10 MG/ML IV BOLUS
INTRAVENOUS | Status: DC | PRN
  Administered 2024-06-29: 40 mg via INTRAVENOUS

## 2024-06-29 MED ORDER — TRIAMCINOLONE ACETONIDE 40 MG/ML IJ SUSP
INTRAMUSCULAR | Status: DC | PRN
  Administered 2024-06-29: 80 mg via INTRAMUSCULAR

## 2024-06-29 MED ORDER — CEFAZOLIN SODIUM-DEXTROSE 2-4 GM/100ML-% IV SOLN
INTRAVENOUS | Status: AC
  Filled 2024-06-29: qty 100

## 2024-06-29 MED ORDER — OXYCODONE HCL 5 MG PO TABS: 5.0000 mg | ORAL_TABLET | ORAL | 0 refills | Status: DC | PRN

## 2024-06-29 MED ORDER — ACETAMINOPHEN 500 MG PO TABS
1000.0000 mg | ORAL_TABLET | Freq: Four times a day (QID) | ORAL | Status: DC
  Administered 2024-06-29 – 2024-06-30 (×2): 1000 mg via ORAL
  Filled 2024-06-29 (×3): qty 2

## 2024-06-29 MED ORDER — FENTANYL CITRATE (PF) 100 MCG/2ML IJ SOLN
INTRAMUSCULAR | Status: AC
  Filled 2024-06-29: qty 2

## 2024-06-29 MED ORDER — LIDOCAINE HCL (PF) 2 % IJ SOLN
INTRAMUSCULAR | Status: AC
  Filled 2024-06-29: qty 5

## 2024-06-29 MED ORDER — CEFAZOLIN SODIUM-DEXTROSE 2-4 GM/100ML-% IV SOLN
2.0000 g | Freq: Four times a day (QID) | INTRAVENOUS | Status: AC
  Administered 2024-06-29 (×2): 2 g via INTRAVENOUS
  Filled 2024-06-29: qty 100

## 2024-06-29 MED ORDER — CHLORHEXIDINE GLUCONATE 0.12 % MT SOLN
OROMUCOSAL | Status: AC
  Filled 2024-06-29: qty 15

## 2024-06-29 MED ORDER — BISACODYL 10 MG RE SUPP: 10.0000 mg | Freq: Every day | RECTAL | Status: DC | PRN

## 2024-06-29 NOTE — Anesthesia Procedure Notes (Addendum)
 Spinal  Patient location during procedure: OR Start time: 06/29/2024 7:35 AM End time: 06/29/2024 7:50 AM Reason for block: surgical anesthesia  Staffing Performed: resident/CRNA  Authorized by: Chesley Lendia CROME, MD   Performed by: Dorcus Juliene SAILOR, CRNA  Preanesthetic Checklist Completed: patient identified, IV checked, site marked, risks and benefits discussed, surgical consent, monitors and equipment checked, pre-op evaluation and timeout performed Spinal Block Patient position: sitting Prep: Betadine Patient monitoring: heart rate, continuous pulse ox, blood pressure and cardiac monitor Approach: midline Location: L4-5 Injection technique: single-shot Needle Needle type: Quincke  Needle gauge: 22 G Needle length: 9 cm Assessment Events: CSF return  Additional Notes Negative paresthesia. Negative blood return. Positive free-flowing CSF. Expiration date of kit checked and confirmed. Patient tolerated procedure well, without complications.

## 2024-06-29 NOTE — Op Note (Signed)
 06/29/2024  9:45 AM  Patient:   Dustin Nguyen  Pre-Op Diagnosis:   Degenerative joint disease, left knee.  Post-Op Diagnosis:   Same  Procedure:   Left TKA using all-pressfit Zimmer Persona system with a #9 narrow PCR femur, a(n) E-sized  tibial tray with a 10 mm medial congruent E-poly insert, and a 9 x 32 mm all-poly 3-pegged domed patella.  Surgeon:   DOROTHA Reyes Maltos, MD  Assistant:   Gustavo Level, PA-C   Anesthesia:   Spinal  Findings:   As above  Complications:   None  EBL:   5 cc  Fluids:   350 cc crystalloid  UOP:   None  TT:   80 minutes at 300 mmHg  Drains:   None  Closure:   Staples  Implants:   As above  Brief Clinical Note:   The patient is a 59 year old male with a long history of progressively worsening left knee pain. The patient's symptoms have progressed despite medications, activity modification, injections, etc. The patient's history and examination were consistent with advanced degenerative joint disease of the left knee confirmed by plain radiographs. The patient presents at this time for a left total knee arthroplasty.  Procedure:   The patient was brought into the operating room. After adequate spinal anesthesia was obtained, the patient was repositioned in the supine position on the operating room table. The left lower extremity was prepped with ChloraPrep solution and draped sterilely. Preoperative antibiotics were administered. A timeout was performed to verify the appropriate surgical site before the limb was exsanguinated with an Esmarch and the tourniquet inflated to 300 mmHg.   A standard anterior approach to the knee was made through an approximately 6-7 inch incision. The incision was carried down through the subcutaneous tissues to expose superficial retinaculum. This was split the length of the incision and the medial flap elevated sufficiently to expose the medial retinaculum. The medial retinaculum was incised, leaving a 3-4 mm cuff  of tissue on the patella. This was extended distally along the medial border of the patellar tendon and proximally through the medial third of the quadriceps tendon. A subtotal fat pad excision was performed before the soft tissues were elevated off the anteromedial and anterolateral aspects of the proximal tibia to the level of the collateral ligaments. The anterior portions of the medial and lateral menisci were removed, as was the anterior cruciate ligament. With the knee flexed to 90, the external tibial guide was positioned and the appropriate proximal tibial cut made. This piece was taken to the back table where it was measured and found to be optimally replicated by a(n) E-sized component.  Attention was directed to the distal femur. The intramedullary canal was accessed through a 3/8 drill hole. The intramedullary guide was inserted and placed at 5 of valgus alignment. Using the +0 slot, the distal cut was made. The distal femur was measured and found to be optimally replicated by the #9 component. The #9 4-in-1 cutting block was positioned and first the posterior, then the posterior chamfer, the anterior, and finally the anterior chamfer cuts were made after verifying that the anterior cortex would not be notched.   At this point, the posterior portions medial and lateral menisci were removed. A trial reduction was performed using the appropriate femoral and tibial components with the 10 mm insert. This demonstrated excellent stability to varus and valgus stressing both in flexion and extension while permitting full extension. Patellar tracking was assessed and found to be  excellent. The tibial trial position was marked on the proximal tibia. The patella thickness was measured and found to be 21 mm, so the appropriate cut was made. The patellar surface was measured and found to be optimally replicated by the 32 mm component. The three peg holes were drilled in place before the trial button was  inserted. Patella tracking was assessed and found to be excellent, passing the no thumb test. The lug holes were drilled into the distal femur before the trial component was removed.  The tibial tray was repositioned before the keel was created using the appropriate tower, drills, and punch.  The bony surfaces were prepared for implantation by irrigating them thoroughly with sterile saline solution via the jet lavage system. A bone plug was fashioned from some of the bone that had been removed previously and used to plug the distal femoral canal. In addition, a cocktail of 20 cc of Exparel , 30 cc of 0.5% Sensorcaine , 2 cc of Kenalog  40 (80 mg), and 30 mg of Toradol  diluted out to 90 cc with normal saline was injected into the postero-medial and postero-lateral aspects of the knee, the medial and lateral gutter regions, and the peri-incisional tissues to help with postoperative analgesia.    The tibial tray was impacted into place first with care taken to be sure the component was fully seated. Next, the femoral component was impacted into place again with care taken to be sure that the component was fully improperly seated. The permanent 10 mm medial congruent E-polyethylene insert was snapped into place with care taken to ensure appropriate locking of the insert. Finally, the patella was positioned and compressed into place using the patellar clamp. Again, care was taken to be sure that the component was fully seated. The knee was placed through a range of motion with the findings as described above.    The wound was copiously irrigated with sterile saline solution using the jet lavage system before the quadriceps tendon and retinacular layer were reapproximated using #0 Vicryl interrupted sutures. The superficial retinacular layer also was closed using a running #0 Vicryl suture. The subcutaneous tissues were closed in several layers using 2-0 Vicryl interrupted sutures. The skin was closed using  staples. A sterile honeycomb dressing was applied to the skin before the leg was wrapped with an Ace wrap to accommodate the Polar Care device. The patient was then awakened and returned to the recovery room in satisfactory condition after tolerating the procedure well.

## 2024-06-29 NOTE — Evaluation (Signed)
 Physical Therapy Evaluation Patient Details Name: Dustin Nguyen MRN: [id number] DOB: 1966-03-07 Today's Date: 06/29/2024  History of Present Illness  [name] s/p left TKA  Clinical Impression  Patient noted to be in supine position at PT arrival in room, for an initial PT evaluation due to a decline in functional status, with baseline mobility reported as independent, and currently requiring modA to stand at bedside due to numbness. The patient resides in a house and lives with wife with family/friend support. There are 6 STE inside the residence. Gait was deferred at this time due to LE numbness. Recommended skilled PT will address safety, mobility, and discharge planning.        If plan is discharge home, recommend the following: A little help with walking and/or transfers;A little help with bathing/dressing/bathroom   Can travel by private vehicle        Equipment Recommendations BSC/3in1  Recommendations for Other Services       Functional Status Assessment Patient has had a recent decline in their functional status and demonstrates the ability to make significant improvements in function in a reasonable and predictable amount of time.     Precautions / Restrictions Restrictions Weight Bearing Restrictions Per Provider Order: Yes RLE Weight Bearing Per Provider Order: Weight bearing as tolerated      Mobility  Bed Mobility Overal bed mobility: Needs Assistance Bed Mobility: Supine to Sit, Sit to Supine     Supine to sit: Contact guard Sit to supine: Contact guard assist        Transfers Overall transfer level: Needs assistance Equipment used: Rolling walker (2 wheels) Transfers: Sit to/from Stand Sit to Stand: Mod assist           General transfer comment: bilateral knee block due to continued LE numbness    Ambulation/Gait               General Gait Details: deferred  Stairs            Wheelchair Mobility     Tilt  Bed    Modified Rankin (Stroke Patients Only)       Balance Overall balance assessment: Mild deficits observed, not formally tested                                           Pertinent Vitals/Pain Pain Assessment Pain Assessment: Faces Faces Pain Scale: Hurts little more Pain Location: L knee Pain Descriptors / Indicators: Aching, Throbbing Pain Intervention(s): Limited activity within patient's tolerance, Monitored during session    Home Living Family/patient expects to be discharged to:: Private residence Living Arrangements: Spouse/significant other Available Help at Discharge: Family Type of Home: House Home Access: Stairs to enter Entrance Stairs-Rails: Left Entrance Stairs-Number of Steps: 6   Home Layout: One level Home Equipment: Agricultural Consultant (2 wheels)      Prior Function Prior Level of Function : Independent/Modified Independent                     Extremity/Trunk Assessment   Upper Extremity Assessment Upper Extremity Assessment: Overall WFL for tasks assessed    Lower Extremity Assessment Lower Extremity Assessment: Generalized weakness (bilateral LE numbness)    Cervical / Trunk Assessment Cervical / Trunk Assessment: Normal  Communication   Communication Communication: No apparent difficulties    Cognition Arousal: Alert Behavior During Therapy: WFL for tasks assessed/performed  PT - Cognitive impairments: No apparent impairments                         Following commands: Intact       Cueing Cueing Techniques: Verbal cues     General Comments      Exercises Total Joint Exercises Ankle Circles/Pumps: AROM, Left, Limitations Ankle Circles/Pumps Limitations: numbness Quad Sets: AROM, Left, 5 reps Gluteal Sets: AROM, Left, 10 reps Short Arc Quad: AROM, Left, 10 reps Heel Slides: AROM, Left, 10 reps Hip ABduction/ADduction: AROM, Left, 10 reps Straight Leg Raises: AROM, Left, 5 reps Long Arc  Quad: AROM, Left, 5 reps Goniometric ROM: 95   Assessment/Plan    PT Assessment Patient needs continued PT services  PT Problem List Decreased strength;Decreased range of motion;Decreased activity tolerance;Decreased balance;Decreased mobility       PT Treatment Interventions Gait training;Stair training;Functional mobility training;Therapeutic activities;Therapeutic exercise;Patient/family education;Neuromuscular re-education;Balance training    PT Goals (Current goals can be found in the Care Plan section)  Acute Rehab PT Goals Patient Stated Goal: Pt wants to go him PT Goal Formulation: With patient Time For Goal Achievement: 07/13/24 Potential to Achieve Goals: Good    Frequency BID     Co-evaluation               AM-PAC PT 6 Clicks Mobility  Outcome Measure Help needed turning from your back to your side while in a flat bed without using bedrails?: None Help needed moving from lying on your back to sitting on the side of a flat bed without using bedrails?: None Help needed moving to and from a bed to a chair (including a wheelchair)?: A Little Help needed standing up from a chair using your arms (e.g., wheelchair or bedside chair)?: A Little Help needed to walk in hospital room?: Total Help needed climbing 3-5 steps with a railing? : Total 6 Click Score: 16    End of Session   Activity Tolerance: Patient tolerated treatment well;Other (comment) (limited by LE nunbness) Patient left: in bed;with family/visitor present Nurse Communication: Mobility status PT Visit Diagnosis: Other abnormalities of gait and mobility (R26.89);Difficulty in walking, not elsewhere classified (R26.2);Muscle weakness (generalized) (M62.81)    Time: 8460-8393 PT Time Calculation (min) (ACUTE ONLY): 27 min   Charges:   PT Evaluation $PT Eval Low Complexity: 1 Low   PT General Charges $$ ACUTE PT VISIT: 1 Visit         Sherlean Lesches DPT, PT    Sherlean A  Zacory Fiola 06/29/2024, 4:30 PM

## 2024-06-29 NOTE — H&P (Signed)
 History of Present Illness:  Dustin Nguyen is a 59 y.o. male who presents for follow-up of his left knee pain secondary to a medial meniscus tear with underlying degenerative joint disease of the left knee. The patient notes little improvement in his symptoms since his last visit 7 weeks ago. In fact, he feels that his symptoms have worsened. He rates his pain at 8/10 on today's visit. He has been taking Celebrex on a daily basis with limited benefit. His symptoms are worse with any weightbearing activities. He is ambulating without assistive devices, but has difficulty reciprocating stairs. He denies any reinjury to the knee, and denies any numbness or paresthesias down his leg to his foot. Since his last visit, he has undergone an MRI scan of the left knee and presents today to review these results.  Current Outpatient Medications:  AllopurinoL  (ZYLOPRIM ) 100 MG tablet TAKE 1 TABLET BY MOUTH EVERY DAY 100 tablet 1  sertraline  (ZOLOFT ) 50 MG tablet Take 1 tablet (50 mg total) by mouth once daily (Patient taking differently: Take 150 mg by mouth once daily) 15 tablet 0  finasteride (PROSCAR) 5 mg tablet Take 1 tablet (5 mg total) by mouth once daily 90 tablet 3  melatonin 10 mg Tab Take by mouth at bedtime  multivitamin capsule Take 1 capsule by mouth once daily  MULTIVITAMIN ORAL Take by mouth  pravastatin  (PRAVACHOL ) 40 MG tablet TAKE 1 TABLET BY MOUTH EVERYDAY AT BEDTIME 100 tablet 1  predniSONE  (DELTASONE ) 10 MG tablet Take 2 tabs twice daily for 2 days then 1 tab twice daily for 2 days then 1 tab once a day for 2 days then stop 14 tablet 0  tamsulosin (FLOMAX) 0.4 mg capsule Take 1 capsule (0.4 mg total) by mouth once daily Take 30 minutes after same meal each day. 90 capsule 3   Allergies:  Other Other (Seasonal)  Past Medical History:  Acid reflux  Arthritis  Chronic pain of right knee 05/23/2020  Depression 2022  Gout, joint 2017 (Only get it in my great toe left foot)  Lab  test positive for detection of COVID-19 virus 01/19/2021 (external testing at [facility])  Primary osteoarthritis of right knee 04/11/2020   Past Surgical History:  Rotator cuff repair right 1995  Limited arthroscopic debridement, arthroscopic subacromial decompression, and mini-open rotator cuff repair, right shoulder Right 06/20/2015 (Dr. Edie)  Manipulation under anesthesia with steriod injection, right shoulder Right 09/20/2015 (Dr. Edie)  ARTHROSCOPIC ROTATOR CUFF REPAIR Left 05/29/2021  Procedure: ARTHROSCOPY, SHOULDER, SURGICAL; WITH ROTATOR CUFF REPAIR; Surgeon: Klifto, Christopher Scott, MD; Location: [facility] OR; Service: Orthopedics; Laterality: Left;  ARTHROSCOPY SHOULDER W/DEBRIDEMENT Left 05/29/2021  Procedure: ARTHROSCOPY, SHOULDER, SURGICAL; DEBRIDEMENT, EXTENSIVE, 3 OR MORE DISCRETE STRUCTURES; Surgeon: Sandralee Lonni Hamilton, MD; Location: [facility] OR; Service: Orthopedics; Laterality: Left;  TENODESIS BICEPS W/TRANSPLANTATION LONG TENDON OPEN Left 05/29/2021  Procedure: TENODESIS OF LONG TENDON OF BICEPS; Surgeon: Klifto, Christopher Scott, MD; Location: [facility] OR; Service: Orthopedics; Laterality: Left;  ARTHROSCOPIC SUBACROMIAL DECOMP Left 05/29/2021  Procedure: ARTHROSCOPY, SHOULDER, SURGICAL; DECOMPRESSION SUBACROMIAL SPACE W/PARTIAL ACROMIOPLASTY, W/CORACOACROMIAL LIGAMENT RELEASE, WHEN PERFORMED (LIST IN ADDITION TO PRIMARY PROCEDURE); Surgeon: Klifto, Christopher Scott, MD; Location: [facility] OR; Service: Orthopedics; Laterality: Left;  ARTHROPLASTY TOTAL KNEE Right 09/09/2022  Procedure: ARTHROPLASTY, KNEE, CONDYLE AND PLATEAU; MEDIAL AND LATERAL COMPARTMENTSWITH OR WITHOUT PATELLA RESURFACING (TOTAL KNEE ARTHROPLASTY); Surgeon: Taft Jayson Lenis, MD; Location: [facility] OR; Service: Orthopedics; Laterality: Right;  EXCISION SALIVARY GLAND Right  KNEE ARTHROSCOPY Bilateral  PHOTOREFRACTIVE KERATOTOMY/LASIK   Family History:  Lung cancer  Mother Rock (She died at 55 years old)  No Known  Problems Father  No Known Problems Sister  Colon cancer Neg Hx  Colon polyps Neg Hx   Social History:   Socioeconomic History:  Marital status: Married  Occupational History  Occupation: Teacher, [employer]  Tobacco Use  Smoking status: Never  Smokeless tobacco: Never  Vaping Use  Vaping status: Never Used  Substance and Sexual Activity  Alcohol use: Yes  Alcohol/week: 10.0 standard drinks of alcohol  Types: 10 Cans of beer per week  Drug use: Never  Sexual activity: Yes  Partners: Female  Birth control/protection: Pill, None   Social Drivers of Health:   Physicist, Medical Strain: Low Risk (01/21/2024)  Overall Financial Resource Strain (CARDIA)  Difficulty of Paying Living Expenses: Not hard at all  Food Insecurity: No Food Insecurity (01/21/2024)  Hunger Vital Sign  Worried About Running Out of Food in the Last Year: Never true  Ran Out of Food in the Last Year: Never true  Transportation Needs: No Transportation Needs (01/21/2024)  PRAPARE - Risk Analyst (Medical): No  Lack of Transportation (Non-Medical): No   Review of Systems:  A comprehensive 14 point ROS was performed, reviewed, and the pertinent orthopaedic findings are documented in the HPI.  Physical Exam: Vitals:  06/18/24 1147  BP: 120/80  Weight: 83.9 kg (185 lb)  Height: 177.8 cm (5' 10)  PainSc: 8  PainLoc: Knee   General/Constitutional: The patient appears to be well-nourished, well-developed, and in no acute distress. Neuro/Psych: Normal mood and affect, oriented to person, place and time. Eyes: Non-icteric. Pupils are equal, round, and reactive to light, and exhibit synchronous movement. ENT: Unremarkable. Lymphatic: No palpable adenopathy. Respiratory: Lungs clear to auscultation, Normal chest excursion, No wheezes, and Non-labored breathing Cardiovascular: Regular rate and rhythm. No murmurs. and No edema, swelling or tenderness, except as noted in detailed  exam. Integumentary: No impressive skin lesions present, except as noted in detailed exam. Musculoskeletal: Unremarkable, except as noted in detailed exam.  Left knee exam: GAIT: Minimal limp, favoring his left leg, but does not use any assistive devices. ALIGNMENT: Normal SKIN: Unremarkable SWELLING: Absent EFFUSION: None WARMTH: None TENDERNESS: Moderate tenderness along medial joint line, but no lateral joint line tenderness ROM: 0-125 degrees with moderate pain in maximal flexion McMURRAY'S: Positive PATELLOFEMORAL: Normal tracking with no peri-patellar tenderness and negative apprehension sign CREPITUS: Mild patellofemoral crepitance LACHMAN'S: Negative PIVOT SHIFT: Not evaluated ANTERIOR DRAWER: Negative POSTERIOR DRAWER: Negative VARUS/VALGUS: Stable  He again is neurovascularly intact to the left lower extremity and foot.  Imaging:  A recent MRI scan of the left knee is available for review and has been reviewed by myself. By report, the study demonstrates evidence of a complex tear of the posterior medial portion of the medial meniscus with extrusion of the body of the medial meniscus. The study also demonstrates evidence of high-grade degenerative changes of all 3 compartments, but primarily the medial more so than the patellofemoral compartments. Both the films and report were reviewed by myself and discussed with the patient.  Assessment: 1. Primary osteoarthritis of left knee.  2. Complex tear of medial meniscus of left knee.   Plan: The treatment options were discussed with the patient. In addition, patient educational materials were provided regarding the diagnosis and treatment options. The patient is quite frustrated by his symptoms and function limitations, and is ready to consider more aggressive treatment options. Given the findings on MRI scan, I am not sure  that an arthroscopic procedure would be sufficient to help alleviate his symptoms. Therefore, I have  recommended a surgical procedure, specifically a left total knee arthroplasty. The procedure was discussed with the patient, as were the potential risks (including bleeding, infection, nerve and/or blood vessel injury, persistent or recurrent pain, loosening and/or failure of the components, dislocation, need for further surgery, blood clots, strokes, heart attacks and/or arhythmias, pneumonia, etc.) and benefits. The patient states his/her understanding and wishes to proceed. All of the patient's questions and concerns were answered. He can call any time with further concerns. He will follow up post-surgery, routine.    H&P reviewed and patient re-examined. No changes.

## 2024-06-29 NOTE — Progress Notes (Signed)
 The beneficiary has a mobility limitation that significantly impairs his/her ability to participate in one or more mobility-related activities of daily living (MRADL) in the home. The patient is able to safely use the walker. The functional mobility deficit can be sufficiently resolved by use of a rolling walker.

## 2024-06-29 NOTE — Progress Notes (Signed)
 This patient is not able to walk the distance required to go the bathroom, or he/she is unable to safely negotiate stairs required to access the bathroom.  A 3-in-1 BSC will alleviate this problem.

## 2024-06-29 NOTE — Discharge Instructions (Signed)
 Orthopedic discharge instructions: May shower with intact OpSite dressing. Apply ice frequently to knee or use Polar Care. Start Eliquis  1 tablet (2.5 mg) twice daily on Wednesday, 06/30/2024, for 2 weeks, then take aspirin 325 mg twice daily for 4 weeks. Take pain medication as prescribed when needed.  May supplement with ES Tylenol  if necessary. May weight-bear as tolerated on left leg - use walker for balance and support. Follow-up in 10-14 days or as scheduled.  Diet: As you were doing prior to hospitalization   Shower:  May shower but keep the wounds dry, use an occlusive plastic wrap, NO SOAKING IN TUB.  If the bandage gets wet, change with a clean dry gauze.  Dressing:  You may change your dressing as needed. Change the dressing with sterile gauze dressing.    Activity:  Increase activity slowly as tolerated, but follow the weight bearing instructions below.  No lifting or driving for 6 weeks.  Weight Bearing:   Weight bearing as tolerated to left lower extremity  To prevent constipation: you may use a stool softener such as -  Colace (over the counter) 100 mg by mouth twice a day  Drink plenty of fluids (prune juice may be helpful) and high fiber foods Miralax (over the counter) for constipation as needed.    Itching:  If you experience itching with your medications, try taking only a single pain pill, or even half a pain pill at a time.  You may take up to 10 pain pills per day, and you can also use benadryl  over the counter for itching or also to help with sleep.   Precautions:  If you experience chest pain or shortness of breath - call 911 immediately for transfer to the hospital emergency department!!  If you develop a fever greater that 101 F, purulent drainage from wound, increased redness or drainage from wound, or calf pain-Call [facility]                                              Follow- Up Appointment:  Please call for an appointment to be seen in 2 weeks at  [facility]

## 2024-06-30 ENCOUNTER — Other Ambulatory Visit: Payer: Self-pay

## 2024-06-30 ENCOUNTER — Other Ambulatory Visit (HOSPITAL_COMMUNITY): Payer: Self-pay

## 2024-06-30 ENCOUNTER — Encounter: Payer: Self-pay | Admitting: Surgery

## 2024-06-30 MED ORDER — OXYCODONE HCL 5 MG PO TABS
5.0000 mg | ORAL_TABLET | ORAL | 0 refills | Status: AC | PRN
  Filled 2024-06-30: qty 40, 4d supply, fill #0

## 2024-06-30 MED ORDER — MUPIROCIN 2 % EX OINT
1.0000 | TOPICAL_OINTMENT | Freq: Two times a day (BID) | CUTANEOUS | 0 refills | Status: AC
  Filled 2024-06-30: qty 66, 33d supply, fill #0

## 2024-06-30 MED ORDER — CHLORHEXIDINE GLUCONATE 4 % EX SOLN
1.0000 | CUTANEOUS | 1 refills | Status: AC
  Filled 2024-06-30: qty 236, 30d supply, fill #0

## 2024-06-30 MED ORDER — KETOROLAC TROMETHAMINE 15 MG/ML IJ SOLN
INTRAMUSCULAR | Status: AC
  Filled 2024-06-30: qty 1

## 2024-06-30 MED ORDER — CHLORHEXIDINE GLUCONATE 4 % EX SOLN
1.0000 | CUTANEOUS | 1 refills | Status: AC
  Filled 2024-06-30: qty 946, fill #0

## 2024-06-30 MED ORDER — MUPIROCIN 2 % EX OINT
1.0000 | TOPICAL_OINTMENT | Freq: Two times a day (BID) | CUTANEOUS | 0 refills | Status: AC
  Filled 2024-06-30: qty 22, 30d supply, fill #0

## 2024-06-30 NOTE — Plan of Care (Signed)
  Problem: Clinical Measurements: Goal: Postoperative complications will be avoided or minimized Outcome: Progressing   

## 2024-06-30 NOTE — TOC Initial Note (Signed)
 Transition of Care [facility]) - Initial/Assessment Note    Patient Details  Name: Dustin Nguyen MRN: [id number] Date of Birth: [birth year]  Transition of Care [facility]) CM/SW Contact:    Cadon Raczka L Henrique Parekh, LCSW Phone Number: 06/30/2024, 9:01 AM  Clinical Narrative:                    DME ordered through [employer] and will be processed if eligible. Home Health was pre-arranged by the surgeons office with [facility].      Patient Goals and CMS Choice            Expected Discharge Plan and Services         Expected Discharge Date: 06/30/24                                    Prior Living Arrangements/Services                       Activities of Daily Living   ADL Screening (condition at time of admission) Independently performs ADLs?: Yes (appropriate for developmental age) Is the patient deaf or have difficulty hearing?: No Does the patient have difficulty seeing, even when wearing glasses/contacts?: No Does the patient have difficulty concentrating, remembering, or making decisions?: No  Permission Sought/Granted                  Emotional Assessment              Admission diagnosis:  Primary osteoarthritis of left knee [M17.12] 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 [place] [address] Phone: [phone] Fax: [phone]  [facility] [address] [place] [address] Phone: [phone] Fax: [phone]     Social Drivers of Health (SDOH) Social History: SDOH Screenings   Food Insecurity: No Food Insecurity (06/29/2024)  Housing: Low Risk (06/29/2024)  Transportation Needs: No Transportation Needs (06/29/2024)   Utilities: Not At Risk (06/29/2024)  Financial Resource Strain: Low Risk  (01/21/2024)   Received from [facility]  Tobacco Use: Low Risk (06/29/2024)   SDOH Interventions:     Readmission Risk Interventions     No data to display

## 2024-06-30 NOTE — Progress Notes (Signed)
 Physical Therapy Treatment Patient Details Name: Dustin Nguyen MRN: [id number] DOB: 11-09-1965 Today's Date: 06/30/2024   History of Present Illness Dustin Nguyen s/p left TKA    PT Comments  Pt was long sitting in bed upon arrival. He is A and O x 4. Agreeable and motivated. Safely demonstrated abilities to exit bed, stand, and tolerate ambulation with RW without LOB. Also demonstrated safe performance of ascending/descending stairs to simulate home entry/exit. Overall progressing well. AROM 2-88 degrees after stretching performed. Cleared from an acute PT standpoint for safe DC home with HHPT to follow.     If plan is discharge home, recommend the following: A little help with walking and/or transfers;A little help with bathing/dressing/bathroom     Equipment Recommendations  BSC/3in1       Precautions / Restrictions Precautions Precautions: Knee Precaution Booklet Issued: Yes (comment) Recall of Precautions/Restrictions: Intact Restrictions Weight Bearing Restrictions Per Provider Order: Yes RLE Weight Bearing Per Provider Order: Weight bearing as tolerated     Mobility  Bed Mobility Overal bed mobility: Modified Independent     Transfers Overall transfer level: Modified independent Equipment used: Rolling walker (2 wheels) Transfers: Sit to/from Stand Sit to Stand: Supervision   Ambulation/Gait Ambulation/Gait assistance: Supervision Gait Distance (Feet): 250 Feet Assistive device: Rolling walker (2 wheels) Gait Pattern/deviations: Step-through pattern, Decreased weight shift to left Gait velocity: decreased  General Gait Details: no LOB however pt does rely heavily on BUE during dynamic standing activity   Stairs Stairs: Yes Stairs assistance: Contact guard assist Stair Management: Step to pattern, Backwards, With walker Number of Stairs: 2 General stair comments: Pt performed ascending/descending stairs to simulate home entry/exit. no rails.      Balance Overall balance assessment: Modified Independent      Communication Communication Communication: No apparent difficulties  Cognition Arousal: Alert Behavior During Therapy: WFL for tasks assessed/performed   PT - Cognitive impairments: No apparent impairments      PT - Cognition Comments: Pt is A and O x 4 Following commands: Intact      Cueing Cueing Techniques: Verbal cues, Tactile cues     General Comments General comments (skin integrity, edema, etc.): Reviewed stretches and post acute care needs. Pt states understanding and confidenec in safe DC home today        PT Goals (current goals can now be found in the care plan section) Acute Rehab PT Goals Patient Stated Goal:  go home. Progress towards PT goals: Progressing toward goals    Frequency    BID       AM-PAC PT 6 Clicks Mobility   Outcome Measure  Help needed turning from your back to your side while in a flat bed without using bedrails?: None Help needed moving from lying on your back to sitting on the side of a flat bed without using bedrails?: None Help needed moving to and from a bed to a chair (including a wheelchair)?: None Help needed standing up from a chair using your arms (e.g., wheelchair or bedside chair)?: None Help needed to walk in hospital room?: A Little Help needed climbing 3-5 steps with a railing? : A Little 6 Click Score: 22    End of Session   Activity Tolerance: Patient tolerated treatment well Patient left: in bed;with call bell/phone within reach Nurse Communication: Mobility status PT Visit Diagnosis: Other abnormalities of gait and mobility (R26.89);Difficulty in walking, not elsewhere classified (R26.2);Muscle weakness (generalized) (M62.81)     Time: 9246-9185 PT Time Calculation (min) (ACUTE  ONLY): 21 min  Charges:    $Gait Training: 8-22 mins PT General Charges $$ ACUTE PT VISIT: 1 Visit                     Rankin Essex PTA 06/30/24, 9:55 AM

## 2024-06-30 NOTE — Discharge Summary (Signed)
 " Physician Discharge Summary  Patient ID: Dustin Nguyen MRN: [id number] DOB/AGE: [birth year] 59 y.o.  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   Consultants (if any):   Discharged Condition: Improved  Hospital Course: Dustin Nguyen is an 59 y.o. male who was admitted 06/29/2024 with a diagnosis of primary osteoarthritis of the left knee and went to the operating room on 06/29/2024 and underwent the above named procedures.    Surgeries: Procedures: ARTHROPLASTY, KNEE, TOTAL on 06/29/2024 Patient tolerated the surgery well. Taken to PACU where he was stabilized and then transferred to the post-op recovery area.  Started on Eliquis  2.5mg  every 12 hrs. Heels elevated on bed with rolled towels. No evidence of DVT. Negative Homan. Physical therapy started on day #0 for gait training and transfer. OT started day #1 for ADL and assisted devices.  Patient's IV was removed on POD1.  Implants: Left TKA using all-pressfit Zimmer Persona system with a #9 narrow PCR femur, a(n) E-sized  tibial tray with a 10 mm medial congruent E-poly insert, and a 9 x 32 mm all-poly 3-pegged domed patella.   He was given perioperative antibiotics:  Anti-infectives (From admission, onward)    Start     Dose/Rate Route Frequency Ordered Stop   06/29/24 1400  ceFAZolin  (ANCEF ) IVPB 2g/100 mL premix        2 g 200 mL/hr over 30 Minutes Intravenous Every 6 hours 06/29/24 0946 06/29/24 2036   06/29/24 0615  ceFAZolin  (ANCEF ) IVPB 2g/100  mL premix        2 g 200 mL/hr over 30 Minutes Intravenous On call to O.R. 06/29/24 9391 06/29/24 0813     .  He was given sequential compression devices, early ambulation, and Eliquis  for DVT prophylaxis.  He benefited maximally from the hospital stay and there were no complications.    Recent vital signs:  Vitals:   06/29/24 2316 06/30/24 0500  BP: 137/72 (!) 146/83  Pulse: 60 (!) 57  Resp: 16 16  Temp: (!) 97.4 F (36.3 C) (!) 97.4 F (36.3 C)  SpO2: 95% 96%    Recent laboratory studies:  Lab Results  Component Value Date   HGB 15.4 06/18/2024   HGB 14.8 04/01/2012   Lab Results  Component Value Date   WBC 7.8 06/18/2024   PLT 290 06/18/2024   No results found for: INR Lab Results  Component Value Date   NA 140 06/18/2024   K 4.0 06/18/2024   CL 105 06/18/2024   CO2 21 (L) 06/18/2024   BUN 17 06/18/2024   CREATININE 0.88 06/18/2024   GLUCOSE 71 06/18/2024    Discharge Medications:   Allergies as of 06/30/2024   No Known Allergies      Medication List     STOP taking these medications    predniSONE  10 MG tablet Commonly known as: DELTASONE        TAKE these medications    allopurinol  100 MG tablet Commonly known as: ZYLOPRIM  Take  100 mg by mouth at bedtime.   apixaban  2.5 MG Tabs tablet Commonly known as: Eliquis  Take 1 tablet (2.5 mg total) by mouth 2 (two) times daily.   Centrum Men 50+ MultiGummies Chew Chew 2 Pieces by mouth daily.   chlorhexidine  4 % external liquid Commonly known as: HIBICLENS  Apply 15 mLs (1 Application total) topically as directed for 30 doses. Use as directed daily for 5 days every other week for 6 weeks.   chlorhexidine  4 % external liquid Commonly known as: HIBICLENS  Apply 15 mLs (1 Application total) topically as directed for 30 doses. Use as directed daily for 5 days every other week for 6 weeks.   mupirocin  ointment 2 % Commonly known as: BACTROBAN  Place 1 Application into the nose 2 (two) times  daily for 60 doses. Use as directed 2 times daily for 5 days every other week for 6 weeks.   mupirocin  ointment 2 % Commonly known as: BACTROBAN  Place 1 Application into the nose 2 (two) times daily for 60 doses. Use as directed 2 times daily for 5 days every other week for 6 weeks.   oxyCODONE  5 MG immediate release tablet Commonly known as: Roxicodone  Take 1-2 tablets (5-10 mg total) by mouth every 4 (four) hours as needed for moderate pain (pain score 4-6) or severe pain (pain score 7-10).   pravastatin  40 MG tablet Commonly known as: PRAVACHOL  Take 40 mg by mouth at bedtime.   sertraline  100 MG tablet Commonly known as: ZOLOFT  Take 150 mg by mouth at bedtime.               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 EXAM: PORTABLE LEFT KNEE - 1-2 VIEW COMPARISON:  Frontal view earlier today FINDINGS: Left knee arthroplasty in expected alignment. No periprosthetic lucency or fracture. Recent postsurgical change includes air and edema in the soft tissues and joint space. Anterior skin staples in place. IMPRESSION: Left knee arthroplasty without immediate postoperative complication. Electronically Signed   By: Andrea Gasman M.D.   On: 06/29/2024 14:53   DG Knee 1-2 Views Left Result Date: 06/29/2024 CLINICAL DATA:  Broken instrument EXAM: LEFT KNEE - 1 VIEW COMPARISON:  None Available. FINDINGS: Single frontal view of the left knee. Postsurgical changes of knee arthroplasty. Femoral and tibial hardware appear intact and well seated. No  radiopaque foreign body. IMPRESSION: Postsurgical changes of knee arthroplasty. No radiopaque foreign body. These results were discussed by telephone on 06/29/2024 at 9:37 am with operating room staff of Dr. NORLEEN MALTOS . Electronically Signed   By: Limin  Xu M.D.   On: 06/29/2024 09:37    Disposition: Plan for d/c home today pending progress with PT.     Follow-up Information     Kip Lynwood Double, PA-C Follow up in 14 day(s).   Specialty: Physician Assistant Why: Staple Removal. Contact  information: [address] [place] [address] [phone]                  Signed: Lynwood LITTIE Level PA-C 06/30/2024, 7:43 AM  "

## 2024-06-30 NOTE — Progress Notes (Signed)
" °  Subjective: 1 Day Post-Op Procedures (LRB): ARTHROPLASTY, KNEE, TOTAL (Left) Patient reports pain as mild.   Patient is well, and has had no acute complaints or problems Plan is to go Home after hospital stay. Negative for chest pain and shortness of breath Fever: no Gastrointestinal:Negative for nausea and vomiting Reports he is passing gas this AM.  Objective: Vital signs in last 24 hours: Temp:  [97 F (36.1 C)-98.5 F (36.9 C)] 97.4 F (36.3 C) (02/04 0500) Pulse Rate:  [54-65] 57 (02/04 0500) Resp:  [10-18] 16 (02/04 0500) BP: (106-146)/(65-91) 146/83 (02/04 0500) SpO2:  [94 %-100 %] 96 % (02/04 0500)  Intake/Output from previous day:  Intake/Output Summary (Last 24 hours) at 06/30/2024 0740 Last data filed at 06/30/2024 0510 Gross per 24 hour  Intake 1815 ml  Output 1380 ml  Net 435 ml    Intake/Output this shift: No intake/output data recorded.  Labs: No results for input(s): HGB in the last 72 hours. No results for input(s): WBC, RBC, HCT, PLT in the last 72 hours. No results for input(s): NA, K, CL, CO2, BUN, CREATININE, GLUCOSE, CALCIUM in the last 72 hours. No results for input(s): LABPT, INR in the last 72 hours.   EXAM General - Patient is Alert, Appropriate, and Oriented Extremity - ABD soft Sensation intact distally Intact pulses distally Incision: dressing C/D/I No cellulitis present Compartment soft Dressing/Incision - clean, dry, no drainage noted to the left knee honeycomb dressing. Motor Function - Plantarflexion intact, still some weakness with dorsiflexion of the left foot.  Intact to sensation to b/l lower extremities. Negative homans test.  Past Medical History:  Diagnosis Date   Depression    Gout    Hyperlipidemia    Osteoarthritis of left knee    Pre-diabetes    Stomach ulcer    2010    Assessment/Plan: 1 Day Post-Op Procedures (LRB): ARTHROPLASTY, KNEE, TOTAL (Left) Principal Problem:   Status  post total knee replacement not using cement, left  Estimated body mass index is 26.54 kg/m as calculated from the following:   Height as of this encounter: 5' 10 (1.778 m).   Weight as of this encounter: 83.9 kg. Advance diet Up with therapy D/C IV fluids when tolerating po intake.  Vitals reviewed this AM. Patient is passing gas without pain. Still some weakness noted with dorsiflexion of the left foot, likely from numbing medication injected at the time of surgery.  Continue to monitor. Up with PT today. Plan for d/c home today pending progress with PT.  DVT Prophylaxis - TED hose and Eliquis  Weight-Bearing as tolerated to left leg  J. Gustavo Level, PA-C [facility] 06/30/2024, 7:40 AM  "

## 2024-06-30 NOTE — Progress Notes (Signed)
 DISCHARGE NOTE:  Pt given discharge instructions and verbalized understanding. Beds to meds medications and BSC  sent with pt, TED hose on both leg. Pt wheeled to car by staff, wife providing transportation home.

## 2024-06-30 NOTE — Anesthesia Postprocedure Evaluation (Signed)
"   Anesthesia Post Note  Patient: Dustin Nguyen  Procedure(s) Performed: ARTHROPLASTY, KNEE, TOTAL (Left: Knee)  Patient location during evaluation: Nursing Unit Anesthesia Type: Spinal Level of consciousness: awake and awake and alert Pain management: pain level controlled Vital Signs Assessment: post-procedure vital signs reviewed and stable Respiratory status: spontaneous breathing and nonlabored ventilation Cardiovascular status: stable Postop Assessment: no headache and no backache Anesthetic complications: no   No notable events documented.   Last Vitals:  Vitals:   06/29/24 2316 06/30/24 0500  BP: 137/72 (!) 146/83  Pulse: 60 (!) 57  Resp: 16 16  Temp: (!) 36.3 C (!) 36.3 C  SpO2: 95% 96%    Last Pain:  Vitals:   06/30/24 0500  TempSrc: Temporal  PainSc: Asleep                 Eyob Godlewski      "
